# Patient Record
Sex: Female | Born: 1955 | Race: White | Hispanic: No | Marital: Married | Smoking: Current every day smoker
Health system: Southern US, Community
[De-identification: ages and names within clinical notes are randomized; demographics above are authoritative.]

## PROBLEM LIST (undated history)

## (undated) DIAGNOSIS — I639 Cerebral infarction, unspecified: Secondary | ICD-10-CM

## (undated) DIAGNOSIS — I10 Essential (primary) hypertension: Secondary | ICD-10-CM

## (undated) DIAGNOSIS — E079 Disorder of thyroid, unspecified: Secondary | ICD-10-CM

## (undated) DIAGNOSIS — B1920 Unspecified viral hepatitis C without hepatic coma: Secondary | ICD-10-CM

## (undated) HISTORY — PX: APPENDECTOMY: SHX54

## (undated) HISTORY — PX: CHOLECYSTECTOMY: SHX55

## (undated) HISTORY — PX: HAND SURGERY: SHX662

---

## 2014-11-20 ENCOUNTER — Telehealth: Payer: Self-pay | Admitting: Hematology & Oncology

## 2014-11-20 NOTE — Telephone Encounter (Signed)
LT MESS WITH Cindy Hubbard REGARDING MOTHERS NEW PT APPT . DAUGHTER CONFIRMED APPT

## 2014-11-27 ENCOUNTER — Encounter (HOSPITAL_COMMUNITY): Payer: Self-pay | Admitting: Emergency Medicine

## 2014-11-27 ENCOUNTER — Emergency Department (HOSPITAL_COMMUNITY)
Admission: EM | Admit: 2014-11-27 | Discharge: 2014-11-28 | Disposition: A | Discharge status: Home / Self Care | Payer: Managed Care, Other (non HMO) | Attending: Emergency Medicine | Admitting: Emergency Medicine

## 2014-11-27 ENCOUNTER — Emergency Department (HOSPITAL_COMMUNITY): Payer: Managed Care, Other (non HMO)

## 2014-11-27 DIAGNOSIS — Z720 Tobacco use: Secondary | ICD-10-CM | POA: Diagnosis not present

## 2014-11-27 DIAGNOSIS — R51 Headache: Secondary | ICD-10-CM | POA: Diagnosis present

## 2014-11-27 DIAGNOSIS — R112 Nausea with vomiting, unspecified: Secondary | ICD-10-CM | POA: Diagnosis not present

## 2014-11-27 DIAGNOSIS — I10 Essential (primary) hypertension: Secondary | ICD-10-CM | POA: Diagnosis not present

## 2014-11-27 DIAGNOSIS — Z8673 Personal history of transient ischemic attack (TIA), and cerebral infarction without residual deficits: Secondary | ICD-10-CM | POA: Diagnosis not present

## 2014-11-27 DIAGNOSIS — G44209 Tension-type headache, unspecified, not intractable: Secondary | ICD-10-CM

## 2014-11-27 DIAGNOSIS — Z8639 Personal history of other endocrine, nutritional and metabolic disease: Secondary | ICD-10-CM | POA: Diagnosis not present

## 2014-11-27 HISTORY — DX: Disorder of thyroid, unspecified: E07.9

## 2014-11-27 HISTORY — DX: Cerebral infarction, unspecified: I63.9

## 2014-11-27 HISTORY — DX: Essential (primary) hypertension: I10

## 2014-11-27 LAB — CBC
HCT: 49.2 % — ABNORMAL HIGH (ref 36.0–46.0)
Hemoglobin: 16.8 g/dL — ABNORMAL HIGH (ref 12.0–15.0)
MCH: 32.1 pg (ref 26.0–34.0)
MCHC: 34.1 g/dL (ref 30.0–36.0)
MCV: 93.9 fL (ref 78.0–100.0)
PLATELETS: 216 10*3/uL (ref 150–400)
RBC: 5.24 MIL/uL — ABNORMAL HIGH (ref 3.87–5.11)
RDW: 13.3 % (ref 11.5–15.5)
WBC: 7.1 10*3/uL (ref 4.0–10.5)

## 2014-11-27 LAB — BASIC METABOLIC PANEL
Anion gap: 11 (ref 5–15)
BUN: 7 mg/dL (ref 6–20)
CALCIUM: 8.8 mg/dL — AB (ref 8.9–10.3)
CO2: 29 mmol/L (ref 22–32)
Chloride: 105 mmol/L (ref 101–111)
Creatinine, Ser: 0.6 mg/dL (ref 0.44–1.00)
GFR calc Af Amer: >60 mL/min (ref >60)
GLUCOSE: 97 mg/dL (ref 65–99)
POTASSIUM: 3.7 mmol/L (ref 3.5–5.1)
Sodium: 145 mmol/L (ref 135–145)

## 2014-11-27 MED ORDER — SODIUM CHLORIDE 0.9 % IV SOLN: 1000.0000 mL | INTRAVENOUS | Status: DC

## 2014-11-27 MED ORDER — SODIUM CHLORIDE 0.9 % IV SOLN
1000.0000 mL | Freq: Once | INTRAVENOUS | Status: AC
  Administered 2014-11-28: 1000 mL via INTRAVENOUS

## 2014-11-27 MED ORDER — FENTANYL CITRATE (PF) 100 MCG/2ML IJ SOLN
INTRAMUSCULAR | Status: AC
  Filled 2014-11-27: qty 2

## 2014-11-27 MED ORDER — FENTANYL CITRATE (PF) 100 MCG/2ML IJ SOLN
50.0000 ug | Freq: Once | INTRAMUSCULAR | Status: AC
  Administered 2014-11-27: 50 ug via INTRAVENOUS

## 2014-11-27 MED ORDER — METOCLOPRAMIDE HCL 5 MG/ML IJ SOLN
10.0000 mg | Freq: Once | INTRAMUSCULAR | Status: AC
  Administered 2014-11-28: 10 mg via INTRAVENOUS
  Filled 2014-11-27: qty 2

## 2014-11-27 MED ORDER — DIPHENHYDRAMINE HCL 50 MG/ML IJ SOLN
25.0000 mg | Freq: Once | INTRAMUSCULAR | Status: AC
  Administered 2014-11-28: 25 mg via INTRAVENOUS
  Filled 2014-11-27: qty 1

## 2014-11-27 NOTE — ED Provider Notes (Signed)
CSN: [id number]     Arrival date & time 11/27/14  2114 History   This chart was scribed for Cindy Fuel, MD by Forrestine Him, ED Scribe. This patient was seen in room D32C/D32C and the patient's care was started 11:42 PM.   Chief Complaint  Patient presents with  . Headache   The history is provided by the patient. No language interpreter was used.    HPI Comments: Cindy Hubbard is a 59 y.o. female with a PMHx of HTN, stroke, and thyroid disease who presents to the Emergency Department complaining of constant, ongoing posterior HA onset 4:00 PM this afternoon; worsened in last few hours. Pain is described as throbbing and worsened with bright lighting. She also reports nausea and 1 episode of vomiting today. Ms. Goulding mentions weakness to her R hand, however, this has now resolved. No OTC medications or home remedies attempted prior to arrival. No recent fever, chills, shortness of breath, chest pain, or abdominal pain. No loss of sensation to extremities. Pt believes above symptoms may be related to her history of high blood pressure. Pt with known allergies to ASA, Biaxin, Demerol, NSAIDS, and Morphine.  Past Medical History  Diagnosis Date  . Stroke   . Hypertension   . Thyroid disease     hyperthyroid   Past Surgical History  Procedure Laterality Date  . Appendectomy    . Cholecystectomy    . Hand surgery     No family history on file. History  Substance Use Topics  . Smoking status: Current Every Day Smoker  . Smokeless tobacco: Not on file  . Alcohol Use: Yes   OB History    No data available     Review of Systems  Constitutional: Negative for fever and chills.  Respiratory: Negative for cough and shortness of breath.   Cardiovascular: Negative for chest pain.  Gastrointestinal: Positive for nausea and vomiting. Negative for abdominal pain.  Neurological: Positive for headaches.  Psychiatric/Behavioral: Negative for confusion.  All other systems reviewed and are  negative.     Allergies  Asa; Biaxin; Demerol; Morphine and related; and Nsaids  Home Medications   Prior to Admission medications   Not on File   Triage Vitals: BP 182/103 mmHg  Pulse 76  Temp(Src) 98 F (36.7 C) (Oral)  Resp 18  SpO2 96%   Physical Exam  Constitutional: She is oriented to person, place, and time. She appears well-developed and well-nourished.  HENT:  Head: Normocephalic.  Eyes: EOM are normal. Pupils are equal, round, and reactive to light.  Fundi are normal   Neck: Normal range of motion. Neck supple. No JVD present.  Moderate tenderness to insertion of L paracervical muscles   Cardiovascular: Normal rate, regular rhythm and normal heart sounds.   No murmur heard. Pulmonary/Chest: Effort normal and breath sounds normal. She has no wheezes. She has no rales. She exhibits no tenderness.  Abdominal: Soft. Bowel sounds are normal. She exhibits no distension and no mass. There is no tenderness.  Musculoskeletal: Normal range of motion. She exhibits no edema.  Lymphadenopathy:    She has no cervical adenopathy.  Neurological: She is alert and oriented to person, place, and time. No cranial nerve deficit.  Skin: Skin is warm and dry. No rash noted.  Psychiatric: She has a normal mood and affect. Thought content normal.  Nursing note and vitals reviewed.   ED Course  Procedures (including critical care time)  DIAGNOSTIC STUDIES: Oxygen Saturation is 96% on RA, adequate by  my interpretation.    COORDINATION OF CARE: 11:47 PM- Will give fluids, Reglan, Benadryl, and Sublimaze. Will order CT head without contrast, CBC, and BMP. Discussed treatment plan with pt at bedside and pt agreed to plan.     Labs Review Results for orders placed or performed during the hospital encounter of 11/27/14  CBC  Result Value Ref Range   WBC 7.1 4.0 - 10.5 K/uL   RBC 5.24 (H) 3.87 - 5.11 MIL/uL   Hemoglobin 16.8 (H) 12.0 - 15.0 g/dL   HCT 49.2 (H) 36.0 - 46.0 %   MCV  93.9 78.0 - 100.0 fL   MCH 32.1 26.0 - 34.0 pg   MCHC 34.1 30.0 - 36.0 g/dL   RDW 13.3 11.5 - 15.5 %   Platelets 216 150 - 400 K/uL  Basic metabolic panel  Result Value Ref Range   Sodium 145 135 - 145 mmol/L   Potassium 3.7 3.5 - 5.1 mmol/L   Chloride 105 101 - 111 mmol/L   CO2 29 22 - 32 mmol/L   Glucose, Bld 97 65 - 99 mg/dL   BUN 7 6 - 20 mg/dL   Creatinine, Ser 0.60 0.44 - 1.00 mg/dL   Calcium 8.8 (L) 8.9 - 10.3 mg/dL   GFR calc non Af Amer >60 >60 mL/min   GFR calc Af Amer >60 >60 mL/min   Anion gap 11 5 - 15    Imaging Review Ct Head Wo Contrast  11/27/2014   CLINICAL DATA:  Progressive headache for 1 day  EXAM: CT HEAD WITHOUT CONTRAST  TECHNIQUE: Contiguous axial images were obtained from the base of the skull through the vertex without intravenous contrast.  COMPARISON:  None.  FINDINGS: There is slight diffuse atrophy. There is no intracranial mass, hemorrhage, extra-axial fluid collection, or midline shift. There is minimal small vessel disease in the centra semiovale bilaterally. Elsewhere, gray-white compartments appear normal. There is no demonstrable acute infarct. Bony calvarium appears intact. Mastoid air cells are clear.  IMPRESSION: Slight atrophy with minimal periventricular small vessel disease. No intracranial mass, hemorrhage, or evidence of acute infarct.   Electronically Signed   By: Lowella Grip III M.D.   On: 11/27/2014 22:27     MDM   Final diagnoses:  Muscle contraction headache    Headache with tenderness of paracervical muscles suggestive of muscle contraction headache. CT of been ordered prior to my seeing her and was unremarkable. She is given a headache cocktail of IV normal saline and metoclopramide and diphenhydramine with excellent relief of headache. She is discharged with prescription for metoclopramide.  I personally performed the services described in this documentation, which was scribed in my presence. The recorded information has been  reviewed and is accurate.     Cindy Fuel, MD [id number]

## 2014-11-27 NOTE — ED Notes (Signed)
Headache started at approx 4pm; blurry vision earlier;

## 2014-11-27 NOTE — ED Notes (Signed)
MD at bedside. 

## 2014-11-27 NOTE — ED Notes (Signed)
Pt sts she is not allergic to fentanyl but is allergic to many other things.

## 2014-11-27 NOTE — ED Notes (Signed)
Pt reports severe headache that started this am which has progressively gotten worse. Pt sts last time she had a headache this bad she had a CVA. Denies weakness/numbness. Speech clear. Pt htn. Pt took bp meds today.

## 2014-11-28 MED ORDER — METOCLOPRAMIDE HCL 10 MG PO TABS: 10.0000 mg | ORAL_TABLET | Freq: Four times a day (QID) | ORAL | Status: DC | PRN

## 2014-11-28 NOTE — Discharge Instructions (Signed)
Tension Headache °A tension headache is a feeling of pain, pressure, or aching often felt over the front and sides of the head. The pain can be dull or can feel tight (constricting). It is the most common type of headache. Tension headaches are not normally associated with nausea or vomiting and do not get worse with physical activity. Tension headaches can last 30 minutes to several days.  °CAUSES  °The exact cause is not known, but it may be caused by chemicals and hormones in the brain that lead to pain. Tension headaches often begin after stress, anxiety, or depression. Other triggers may include: °· Alcohol. °· Caffeine (too much or withdrawal). °· Respiratory infections (colds, flu, sinus infections). °· Dental problems or teeth clenching. °· Fatigue. °· Holding your head and neck in one position too long while using a computer. °SYMPTOMS  °· Pressure around the head.   °· Dull, aching head pain.   °· Pain felt over the front and sides of the head.   °· Tenderness in the muscles of the head, neck, and shoulders. °DIAGNOSIS  °A tension headache is often diagnosed based on:  °· Symptoms.   °· Physical examination.   °· A CT scan or MRI of your head. These tests may be ordered if symptoms are severe or unusual. °TREATMENT  °Medicines may be given to help relieve symptoms.  °HOME CARE INSTRUCTIONS  °· Only take over-the-counter or prescription medicines for pain or discomfort as directed by your caregiver.   °· Lie down in a dark, quiet room when you have a headache.   °· Keep a journal to find out what may be triggering your headaches. For example, write down: °¨ What you eat and drink. °¨ How much sleep you get. °¨ Any change to your diet or medicines. °· Try massage or other relaxation techniques.   °· Ice packs or heat applied to the head and neck can be used. Use these 3 to 4 times per day for 15 to 20 minutes each time, or as needed.   °· Limit stress.   °· Sit up straight, and do not tense your muscles.    °· Quit smoking if you smoke. °· Limit alcohol use. °· Decrease the amount of caffeine you drink, or stop drinking caffeine. °· Eat and exercise regularly. °· Get 7 to 9 hours of sleep, or as recommended by your caregiver. °· Avoid excessive use of pain medicine as recurrent headaches can occur.    °SEEK MEDICAL CARE IF:  °· You have problems with the medicines you were prescribed. °· Your medicines do not work. °· You have a change from the usual headache. °· You have nausea or vomiting. °SEEK IMMEDIATE MEDICAL CARE IF:  °· Your headache becomes severe. °· You have a fever. °· You have a stiff neck. °· You have loss of vision. °· You have muscular weakness or loss of muscle control. °· You lose your balance or have trouble walking. °· You feel faint or pass out. °· You have severe symptoms that are different from your first symptoms. °MAKE SURE YOU:  °· Understand these instructions. °· Will watch your condition. °· Will get help right away if you are not doing well or get worse. °Document Released: 04/07/2005 Document Revised: 06/30/2011 Document Reviewed: 03/28/2011 °[employer]® Patient Information ©2015 [employer]. This information is not intended to replace advice given to you by your health care provider. Make sure you discuss any questions you have with your health care provider. ° °Metoclopramide tablets °What is this medicine? °METOCLOPRAMIDE (met oh kloe PRA mide)   is used to treat the symptoms of gastroesophageal reflux disease (GERD) like heartburn. It is also used to treat people with slow emptying of the stomach and intestinal tract. °This medicine may be used for other purposes; ask your health care provider or pharmacist if you have questions. °COMMON BRAND NAME(S): Reglan °What should I tell my health care provider before I take this medicine? °They need to know if you have any of these conditions: °-breast cancer °-depression °-diabetes °-heart failure °-high blood pressure °-kidney  disease °-liver disease °-Parkinson's disease or a movement disorder °-pheochromocytoma °-seizures °-stomach obstruction, bleeding, or perforation °-an unusual or allergic reaction to metoclopramide, procainamide, sulfites, other medicines, foods, dyes, or preservatives °-pregnant or trying to get pregnant °-breast-feeding °How should I use this medicine? °Take this medicine by mouth with a glass of water. Follow the directions on the prescription label. Take this medicine on an empty stomach, about 30 minutes before eating. Take your doses at regular intervals. Do not take your medicine more often than directed. Do not stop taking except on the advice of your doctor or health care professional. °A special MedGuide will be given to you by the pharmacist with each prescription and refill. Be sure to read this information carefully each time. °Talk to your pediatrician regarding the use of this medicine in children. Special care may be needed. °Overdosage: If you think you have taken too much of this medicine contact a poison control center or emergency room at once. °NOTE: This medicine is only for you. Do not share this medicine with others. °What if I miss a dose? °If you miss a dose, take it as soon as you can. If it is almost time for your next dose, take only that dose. Do not take double or extra doses. °What may interact with this medicine? °-acetaminophen °-cyclosporine °-digoxin °-medicines for blood pressure °-medicines for diabetes, including insulin °-medicines for hay fever and other allergies °-medicines for depression, especially an Monoamine Oxidase Inhibitor (MAOI) °-medicines for Parkinson's disease, like levodopa °-medicines for sleep or for pain °-tetracycline °This list may not describe all possible interactions. Give your health care provider a list of all the medicines, herbs, non-prescription drugs, or dietary supplements you use. Also tell them if you smoke, drink alcohol, or use illegal  drugs. Some items may interact with your medicine. °What should I watch for while using this medicine? °It may take a few weeks for your stomach condition to start to get better. However, do not take this medicine for longer than 12 weeks. The longer you take this medicine, and the more you take it, the greater your chances are of developing serious side effects. If you are an elderly patient, a female patient, or you have diabetes, you may be at an increased risk for side effects from this medicine. Contact your doctor immediately if you start having movements you cannot control such as lip smacking, rapid movements of the tongue, involuntary or uncontrollable movements of the eyes, head, arms and legs, or muscle twitches and spasms. °Patients and their families should watch out for worsening depression or thoughts of suicide. Also watch out for any sudden or severe changes in feelings such as feeling anxious, agitated, panicky, irritable, hostile, aggressive, impulsive, severely restless, overly excited and hyperactive, or not being able to sleep. If this happens, especially at the beginning of treatment or after a change in dose, call your doctor. °Do not treat yourself for high fever. Ask your doctor or health care professional for   advice. You may get drowsy or dizzy. Do not drive, use machinery, or do anything that needs mental alertness until you know how this drug affects you. Do not stand or sit up quickly, especially if you are an older patient. This reduces the risk of dizzy or fainting spells. Alcohol can make you more drowsy and dizzy. Avoid alcoholic drinks. What side effects may I notice from receiving this medicine? Side effects that you should report to your doctor or health care professional as soon as possible: -allergic reactions like skin rash, itching or hives, swelling of the face, lips, or tongue -abnormal production of milk in females -breast enlargement in both males and  females -change in the way you walk -difficulty moving, speaking or swallowing -drooling, lip smacking, or rapid movements of the tongue -excessive sweating -fever -involuntary or uncontrollable movements of the eyes, head, arms and legs -irregular heartbeat or palpitations -muscle twitches and spasms -unusually weak or tired Side effects that usually do not require medical attention (report to your doctor or health care professional if they continue or are bothersome): -change in sex drive or performance -depressed mood -diarrhea -difficulty sleeping -headache -menstrual changes -restless or nervous This list may not describe all possible side effects. Call your doctor for medical advice about side effects. You may report side effects to FDA at 1-800-FDA-1088. Where should I keep my medicine? Keep out of the reach of children. Store at room temperature between 20 and 25 degrees C (68 and 77 degrees F). Protect from light. Keep container tightly closed. Throw away any unused medicine after the expiration date. NOTE: This sheet is a summary. It may not cover all possible information. If you have questions about this medicine, talk to your doctor, pharmacist, or health care provider.  2015, [employer]. (2011-08-05 13:04:38)

## 2014-12-26 ENCOUNTER — Telehealth: Payer: Self-pay | Admitting: Hematology & Oncology

## 2014-12-26 NOTE — Telephone Encounter (Signed)
Patient call and cx new patient apt.  She stated her daughter made this apt and she already has an Materials engineer.

## 2014-12-27 ENCOUNTER — Ambulatory Visit: Payer: Managed Care, Other (non HMO) | Admitting: Hematology & Oncology

## 2014-12-27 ENCOUNTER — Ambulatory Visit: Payer: Managed Care, Other (non HMO)

## 2014-12-27 ENCOUNTER — Other Ambulatory Visit: Payer: Managed Care, Other (non HMO)

## 2019-03-22 ENCOUNTER — Other Ambulatory Visit: Payer: Self-pay

## 2019-03-22 DIAGNOSIS — Z20822 Contact with and (suspected) exposure to covid-19: Secondary | ICD-10-CM

## 2019-03-25 LAB — NOVEL CORONAVIRUS, NAA: SARS-CoV-2, NAA: NOT DETECTED

## 2019-06-01 ENCOUNTER — Inpatient Hospital Stay (HOSPITAL_COMMUNITY)
Admission: EM | Admit: 2019-06-01 | Discharge: 2019-06-20 | DRG: 871 | Disposition: E | Payer: 59 | Attending: Critical Care Medicine | Admitting: Critical Care Medicine

## 2019-06-01 ENCOUNTER — Other Ambulatory Visit: Payer: Self-pay

## 2019-06-01 ENCOUNTER — Encounter (HOSPITAL_COMMUNITY): Payer: Self-pay | Admitting: Emergency Medicine

## 2019-06-01 DIAGNOSIS — I8511 Secondary esophageal varices with bleeding: Secondary | ICD-10-CM | POA: Diagnosis present

## 2019-06-01 DIAGNOSIS — E871 Hypo-osmolality and hyponatremia: Secondary | ICD-10-CM | POA: Diagnosis present

## 2019-06-01 DIAGNOSIS — Z79890 Hormone replacement therapy (postmenopausal): Secondary | ICD-10-CM | POA: Diagnosis not present

## 2019-06-01 DIAGNOSIS — Z885 Allergy status to narcotic agent status: Secondary | ICD-10-CM

## 2019-06-01 DIAGNOSIS — D696 Thrombocytopenia, unspecified: Secondary | ICD-10-CM

## 2019-06-01 DIAGNOSIS — D539 Nutritional anemia, unspecified: Secondary | ICD-10-CM | POA: Diagnosis present

## 2019-06-01 DIAGNOSIS — B182 Chronic viral hepatitis C: Secondary | ICD-10-CM | POA: Diagnosis not present

## 2019-06-01 DIAGNOSIS — E7220 Disorder of urea cycle metabolism, unspecified: Secondary | ICD-10-CM | POA: Diagnosis not present

## 2019-06-01 DIAGNOSIS — Z515 Encounter for palliative care: Secondary | ICD-10-CM | POA: Diagnosis not present

## 2019-06-01 DIAGNOSIS — Z8673 Personal history of transient ischemic attack (TIA), and cerebral infarction without residual deficits: Secondary | ICD-10-CM

## 2019-06-01 DIAGNOSIS — D689 Coagulation defect, unspecified: Secondary | ICD-10-CM

## 2019-06-01 DIAGNOSIS — R892 Abnormal level of other drugs, medicaments and biological substances in specimens from other organs, systems and tissues: Secondary | ICD-10-CM | POA: Diagnosis not present

## 2019-06-01 DIAGNOSIS — F17200 Nicotine dependence, unspecified, uncomplicated: Secondary | ICD-10-CM | POA: Diagnosis not present

## 2019-06-01 DIAGNOSIS — D65 Disseminated intravascular coagulation [defibrination syndrome]: Secondary | ICD-10-CM | POA: Diagnosis present

## 2019-06-01 DIAGNOSIS — K7201 Acute and subacute hepatic failure with coma: Secondary | ICD-10-CM

## 2019-06-01 DIAGNOSIS — N185 Chronic kidney disease, stage 5: Secondary | ICD-10-CM | POA: Diagnosis present

## 2019-06-01 DIAGNOSIS — D62 Acute posthemorrhagic anemia: Secondary | ICD-10-CM | POA: Diagnosis present

## 2019-06-01 DIAGNOSIS — K7041 Alcoholic hepatic failure with coma: Secondary | ICD-10-CM | POA: Diagnosis not present

## 2019-06-01 DIAGNOSIS — I959 Hypotension, unspecified: Secondary | ICD-10-CM

## 2019-06-01 DIAGNOSIS — I10 Essential (primary) hypertension: Secondary | ICD-10-CM

## 2019-06-01 DIAGNOSIS — E039 Hypothyroidism, unspecified: Secondary | ICD-10-CM | POA: Diagnosis present

## 2019-06-01 DIAGNOSIS — K7011 Alcoholic hepatitis with ascites: Secondary | ICD-10-CM | POA: Diagnosis not present

## 2019-06-01 DIAGNOSIS — K767 Hepatorenal syndrome: Secondary | ICD-10-CM | POA: Diagnosis present

## 2019-06-01 DIAGNOSIS — R680 Hypothermia, not associated with low environmental temperature: Secondary | ICD-10-CM | POA: Diagnosis present

## 2019-06-01 DIAGNOSIS — I120 Hypertensive chronic kidney disease with stage 5 chronic kidney disease or end stage renal disease: Secondary | ICD-10-CM | POA: Diagnosis not present

## 2019-06-01 DIAGNOSIS — K922 Gastrointestinal hemorrhage, unspecified: Secondary | ICD-10-CM | POA: Diagnosis present

## 2019-06-01 DIAGNOSIS — K7682 Hepatic encephalopathy: Secondary | ICD-10-CM

## 2019-06-01 DIAGNOSIS — T68XXXA Hypothermia, initial encounter: Secondary | ICD-10-CM

## 2019-06-01 DIAGNOSIS — N179 Acute kidney failure, unspecified: Secondary | ICD-10-CM | POA: Diagnosis not present

## 2019-06-01 DIAGNOSIS — R578 Other shock: Secondary | ICD-10-CM | POA: Diagnosis not present

## 2019-06-01 DIAGNOSIS — K7031 Alcoholic cirrhosis of liver with ascites: Secondary | ICD-10-CM | POA: Diagnosis not present

## 2019-06-01 DIAGNOSIS — R34 Anuria and oliguria: Secondary | ICD-10-CM | POA: Diagnosis not present

## 2019-06-01 DIAGNOSIS — R6520 Severe sepsis without septic shock: Secondary | ICD-10-CM | POA: Diagnosis not present

## 2019-06-01 DIAGNOSIS — R571 Hypovolemic shock: Secondary | ICD-10-CM | POA: Diagnosis not present

## 2019-06-01 DIAGNOSIS — R4587 Impulsiveness: Secondary | ICD-10-CM | POA: Diagnosis not present

## 2019-06-01 DIAGNOSIS — K921 Melena: Secondary | ICD-10-CM | POA: Diagnosis present

## 2019-06-01 DIAGNOSIS — Z20822 Contact with and (suspected) exposure to covid-19: Secondary | ICD-10-CM | POA: Diagnosis present

## 2019-06-01 DIAGNOSIS — R17 Unspecified jaundice: Secondary | ICD-10-CM

## 2019-06-01 DIAGNOSIS — K7290 Hepatic failure, unspecified without coma: Secondary | ICD-10-CM | POA: Diagnosis not present

## 2019-06-01 DIAGNOSIS — E8729 Other acidosis: Secondary | ICD-10-CM

## 2019-06-01 DIAGNOSIS — Z886 Allergy status to analgesic agent status: Secondary | ICD-10-CM

## 2019-06-01 DIAGNOSIS — E872 Acidosis: Secondary | ICD-10-CM | POA: Diagnosis present

## 2019-06-01 DIAGNOSIS — E8770 Fluid overload, unspecified: Secondary | ICD-10-CM | POA: Diagnosis not present

## 2019-06-01 DIAGNOSIS — A419 Sepsis, unspecified organism: Principal | ICD-10-CM | POA: Diagnosis present

## 2019-06-01 DIAGNOSIS — E8809 Other disorders of plasma-protein metabolism, not elsewhere classified: Secondary | ICD-10-CM | POA: Diagnosis not present

## 2019-06-01 DIAGNOSIS — Z881 Allergy status to other antibiotic agents status: Secondary | ICD-10-CM

## 2019-06-01 DIAGNOSIS — Z6841 Body Mass Index (BMI) 40.0 and over, adult: Secondary | ICD-10-CM

## 2019-06-01 DIAGNOSIS — D649 Anemia, unspecified: Secondary | ICD-10-CM

## 2019-06-01 DIAGNOSIS — Z66 Do not resuscitate: Secondary | ICD-10-CM | POA: Diagnosis not present

## 2019-06-01 DIAGNOSIS — R7401 Elevation of levels of liver transaminase levels: Secondary | ICD-10-CM

## 2019-06-01 DIAGNOSIS — E440 Moderate protein-calorie malnutrition: Secondary | ICD-10-CM | POA: Diagnosis not present

## 2019-06-01 DIAGNOSIS — R9431 Abnormal electrocardiogram [ECG] [EKG]: Secondary | ICD-10-CM | POA: Diagnosis not present

## 2019-06-01 DIAGNOSIS — M4800 Spinal stenosis, site unspecified: Secondary | ICD-10-CM | POA: Diagnosis present

## 2019-06-01 DIAGNOSIS — E669 Obesity, unspecified: Secondary | ICD-10-CM | POA: Diagnosis present

## 2019-06-01 HISTORY — DX: Unspecified viral hepatitis C without hepatic coma: B19.20

## 2019-06-01 LAB — CBC
HCT: 17.9 % — ABNORMAL LOW (ref 36.0–46.0)
Hemoglobin: 6.7 g/dL — CL (ref 12.0–15.0)
MCH: 40.9 pg — ABNORMAL HIGH (ref 26.0–34.0)
MCHC: 37.4 g/dL — ABNORMAL HIGH (ref 30.0–36.0)
MCV: 109.1 fL — ABNORMAL HIGH (ref 80.0–100.0)
Platelets: 49 10*3/uL — ABNORMAL LOW (ref 150–400)
RBC: 1.64 MIL/uL — ABNORMAL LOW (ref 3.87–5.11)
RDW: 21.1 % — ABNORMAL HIGH (ref 11.5–15.5)
WBC: 8 10*3/uL (ref 4.0–10.5)
nRBC: 3.4 % — ABNORMAL HIGH (ref 0.0–0.2)

## 2019-06-01 LAB — COMPREHENSIVE METABOLIC PANEL
ALT: 99 U/L — ABNORMAL HIGH (ref 0–44)
AST: 136 U/L — ABNORMAL HIGH (ref 15–41)
Albumin: 2.1 g/dL — ABNORMAL LOW (ref 3.5–5.0)
Alkaline Phosphatase: 80 U/L (ref 38–126)
Anion gap: 19 — ABNORMAL HIGH (ref 5–15)
BUN: 50 mg/dL — ABNORMAL HIGH (ref 8–23)
CO2: 20 mmol/L — ABNORMAL LOW (ref 22–32)
Calcium: 7.7 mg/dL — ABNORMAL LOW (ref 8.9–10.3)
Chloride: 88 mmol/L — ABNORMAL LOW (ref 98–111)
Creatinine, Ser: 5.79 mg/dL — ABNORMAL HIGH (ref 0.44–1.00)
GFR calc Af Amer: 8 mL/min — ABNORMAL LOW (ref >60)
GFR calc non Af Amer: 7 mL/min — ABNORMAL LOW (ref >60)
Glucose, Bld: 96 mg/dL (ref 70–99)
Potassium: 4.8 mmol/L (ref 3.5–5.1)
Sodium: 127 mmol/L — ABNORMAL LOW (ref 135–145)
Total Bilirubin: 10.5 mg/dL — ABNORMAL HIGH (ref 0.3–1.2)
Total Protein: 5.9 g/dL — ABNORMAL LOW (ref 6.5–8.1)

## 2019-06-01 LAB — ETHANOL: Alcohol, Ethyl (B): <10 mg/dL (ref <10)

## 2019-06-01 LAB — RESPIRATORY PANEL BY RT PCR (FLU A&B, COVID)
Influenza A by PCR: NEGATIVE
Influenza B by PCR: NEGATIVE
SARS Coronavirus 2 by RT PCR: NEGATIVE

## 2019-06-01 LAB — RAPID URINE DRUG SCREEN, HOSP PERFORMED
Amphetamines: NOT DETECTED
Barbiturates: NOT DETECTED
Benzodiazepines: POSITIVE — AB
Cocaine: NOT DETECTED
Opiates: NOT DETECTED
Tetrahydrocannabinol: NOT DETECTED

## 2019-06-01 LAB — ABO/RH: ABO/RH(D): O POS

## 2019-06-01 LAB — POC OCCULT BLOOD, ED: Fecal Occult Bld: POSITIVE — AB

## 2019-06-01 LAB — AMMONIA: Ammonia: 130 umol/L — ABNORMAL HIGH (ref 9–35)

## 2019-06-01 LAB — PROTIME-INR
INR: 5.8 (ref 0.8–1.2)
Prothrombin Time: 52.3 seconds — ABNORMAL HIGH (ref 11.4–15.2)

## 2019-06-01 LAB — PREPARE RBC (CROSSMATCH)

## 2019-06-01 MED ORDER — SODIUM CHLORIDE 0.9 % IV SOLN
50.0000 ug/h | INTRAVENOUS | Status: DC
  Administered 2019-06-01 – 2019-06-02 (×3): 50 ug/h via INTRAVENOUS
  Filled 2019-06-01 (×5): qty 1

## 2019-06-01 MED ORDER — OCTREOTIDE ACETATE 500 MCG/ML IJ SOLN
INTRAMUSCULAR | Status: AC
  Filled 2019-06-01: qty 1

## 2019-06-01 MED ORDER — PANTOPRAZOLE SODIUM 40 MG IV SOLR: 40.0000 mg | Freq: Two times a day (BID) | INTRAVENOUS | Status: DC

## 2019-06-01 MED ORDER — SODIUM CHLORIDE 0.9% IV SOLUTION: Freq: Once | INTRAVENOUS | Status: AC

## 2019-06-01 MED ORDER — LACTULOSE ENEMA
300.0000 mL | Freq: Two times a day (BID) | ORAL | Status: DC
  Administered 2019-06-02 (×3): 300 mL via RECTAL
  Filled 2019-06-01 (×9): qty 300

## 2019-06-01 MED ORDER — SODIUM CHLORIDE 0.9% IV SOLUTION: Freq: Once | INTRAVENOUS | Status: DC

## 2019-06-01 MED ORDER — PANTOPRAZOLE SODIUM 40 MG IV SOLR
40.0000 mg | Freq: Once | INTRAVENOUS | Status: AC
  Administered 2019-06-01: 20:00:00 40 mg via INTRAVENOUS
  Filled 2019-06-01: qty 40

## 2019-06-01 MED ORDER — SODIUM CHLORIDE 0.9 % IV BOLUS
1000.0000 mL | Freq: Once | INTRAVENOUS | Status: AC
  Administered 2019-06-01: 20:00:00 1000 mL via INTRAVENOUS

## 2019-06-01 MED ORDER — SODIUM CHLORIDE 0.9 % IV SOLN
8.0000 mg/h | INTRAVENOUS | Status: DC
  Administered 2019-06-02 – 2019-06-03 (×3): 8 mg/h via INTRAVENOUS
  Filled 2019-06-01 (×4): qty 80

## 2019-06-01 MED ORDER — OCTREOTIDE LOAD VIA INFUSION
50.0000 ug | Freq: Once | INTRAVENOUS | Status: AC
  Administered 2019-06-01: 50 ug via INTRAVENOUS
  Filled 2019-06-01: qty 25

## 2019-06-01 MED ORDER — SODIUM CHLORIDE 0.9 % IV SOLN
80.0000 mg | Freq: Once | INTRAVENOUS | Status: AC
  Administered 2019-06-02: 80 mg via INTRAVENOUS
  Filled 2019-06-01: qty 80

## 2019-06-01 NOTE — ED Notes (Signed)
Bear Hugger Applied to Pt. MD made aware of Pts core Temp of 90.8 F rectal.

## 2019-06-01 NOTE — H&P (Addendum)
History and Physical  Cindy Hubbard [id number] DOB: [birth year] DOA: 06/05/2019  Referring physician: Nat Christen PCP: Curly Rim, MD  Patient coming from:  [facility]  Chief Complaint: Rectal Bleeding  HPI: Cindy Hubbard is a 64 y.o. female with medical history significant for hypertension, hepatitis C, hypothyroidism and stroke who presents to [facility] emergency department due to 3-week onset of coffee colored emesis, she also endorsed dark stool within the same timeframe.  She has history of alcohol abuse, but she states that last alcohol consumption was about 2 weeks ago.  Patient complained of increased tiredness and weakness, so she came to the ED for further evaluation.  She denies chest pain, abdominal pain or shortness of breath.  ED Course:  In the emergency department, she was noted to be hypothermic and hypotensive.  Lab work-up showed anemia (H/H of 6.7/17.9) with MCV of 109.1, thrombocytopenia, elevated BUN to creatinine 50/5.79 (no recent labs for comparison of baseline creatinine-last creatinine on record was 0.6 about 4 years ago).  INR 5.8, Albumin 2.1, AST 136, ALT 99, total bilirubin 10.5 and ammonia 130.  Fecal occult blood was positive. SARS-CoV-2 RNA was negative.  Influenza A and B were negative.  Patient was started on IV Protonix drip, IV octreotide was given and patient was provided with IV hydration.  Hospitalist was asked to admit him for further evaluation and management.  Review of Systems: Constitutional: Negative for chills and fever.  HENT: Negative for ear pain and sore throat.   Eyes: Negative for pain and visual disturbance.  Respiratory: Negative for cough, chest tightness and shortness of breath.   Cardiovascular: Negative for chest pain and palpitations.  Gastrointestinal: Positive for vomiting of blood and blood in stool.  Negative for abdominal pain and vomiting.  Endocrine: Negative for polyphagia and polyuria.  Genitourinary: Negative for  decreased urine volume, dysuria, enuresis Musculoskeletal: Negative for arthralgias and back pain.  Skin: Negative for color change and rash.  Allergic/Immunologic: Negative for immunocompromised state.  Neurological: Negative for tremors, syncope, speech difficulty, weakness, light-headedness and headaches.  Hematological: Does not bruise/bleed easily.  All other systems reviewed and are negative   Past Medical History:  Diagnosis Date  . Hepatitis C   . Hypertension   . Stroke ([place])   . Thyroid disease    hyperthyroid   Past Surgical History:  Procedure Laterality Date  . APPENDECTOMY    . CHOLECYSTECTOMY    . HAND SURGERY      Social History:  reports that she has been smoking. She has never used smokeless tobacco. She reports current alcohol use. She reports that she does not use drugs.   Allergies  Allergen Reactions  . Demerol [Meperidine] Anaphylaxis  . Morphine And Related Anaphylaxis  . Asa [Aspirin] Other (See Comments)    bleeding  . Biaxin [Clarithromycin] Other (See Comments)    unknown  . Nsaids Nausea And Vomiting    History reviewed. No pertinent family history.   Prior to Admission medications   Medication Sig Start Date End Date Taking? Authorizing Provider  carvedilol (COREG) 25 MG tablet Take 25 mg by mouth 2 (two) times daily with a meal.    [provider]  cloNIDine (CATAPRES) 0.2 MG tablet Take 0.2 mg by mouth 2 (two) times daily.    [provider]  diazepam (VALIUM) 10 MG tablet Take 10 mg by mouth every 6 (six) hours as needed for anxiety.    [provider]  EPINEPHrine 0.3 mg/0.3 mL  IJ SOAJ injection Inject 0.3 mg into the muscle daily as needed (allergic reaction).    [provider]  levothyroxine (SYNTHROID, LEVOTHROID) 150 MCG tablet Take 150 mcg by mouth daily before breakfast.    [provider]  lisinopril (PRINIVIL,ZESTRIL) 40 MG tablet Take 40 mg by mouth daily.    [provider]  metoCLOPramide (REGLAN) 10 MG tablet Take 1 tablet (10 mg total) by mouth every 6 (six) hours as needed for nausea (or headache). [id number]   Delora Fuel, MD  oxyCODONE-acetaminophen (PERCOCET/ROXICET) 5-325 MG per tablet Take 1 tablet by mouth every 4 (four) hours as needed for severe pain.    [provider]    Physical Exam: BP (!) 83/51   Pulse 61   Temp (!) 94.2 F (34.6 C) (Rectal)   Resp 16   Ht 5\' 5"  (1.651 m)   Wt 90.7 kg   SpO2 98%   BMI 33.28 kg/m   . General: 64 y.o. year-old female well developed well nourished in no acute distress.  Alert and oriented x3. Marland Kitchen HEENT: Normocephalic, atraumatic, sclera icteric . Neck: Supple, trachea medial . Cardiovascular: Regular rate and rhythm with no rubs or gallops.  No thyromegaly or JVD noted.  No lower extremity edema. 2/4 pulses in all 4 extremities. Marland Kitchen Respiratory: Clear to auscultation with no wheezes or rales. Good inspiratory effort. . Abdomen: Soft nontender nondistended with normal bowel sounds x4 quadrants. . Muskuloskeletal: No cyanosis, clubbing or edema noted bilaterally . Neuro: CN II-XII intact, strength, sensation, reflexes . Skin: No ulcerative lesions noted or rashes . Psychiatry: Judgement and insight appear normal. Mood is appropriate for condition and setting          Labs on Admission:  Basic Metabolic Panel: Recent Labs  Lab 06/04/2019 1918  NA 127*  K 4.8  CL 88*  CO2 20*  GLUCOSE 96  BUN 50*  CREATININE 5.79*  CALCIUM 7.7*   Liver Function Tests: Recent Labs  Lab 05/28/2019 1918  AST 136*  ALT 99*  ALKPHOS 80  BILITOT 10.5*  PROT 5.9*  ALBUMIN 2.1*   No results for input(s): LIPASE, AMYLASE in the last 168 hours. Recent Labs  Lab 06/16/2019 2139  AMMONIA 130*   CBC: Recent Labs  Lab 06/16/2019 1918  WBC 8.0  HGB 6.7*  HCT 17.9*  MCV 109.1*  PLT 49*   Cardiac Enzymes: No results for input(s): CKTOTAL, CKMB, CKMBINDEX, TROPONINI in the last 168 hours.  BNP (last 3  results) No results for input(s): BNP in the last 8760 hours.  ProBNP (last 3 results) No results for input(s): PROBNP in the last 8760 hours.  CBG: No results for input(s): GLUCAP in the last 168 hours.  Radiological Exams on Admission: No results found.  EKG: I independently viewed the EKG done and my findings are as followed: Sinus bradycardia at rate of 52bpm with nonspecific T wave abnormality.  Prolonged QTc (527ms)  Assessment/Plan Present on Admission: . Acute GI bleeding  Principal Problem:   Acute GI bleeding Active Problems:   Hypothermia   Hyponatremia   Hypotension   Hyperammonemia (HCC)   Thrombocytopenia (HCC)   AKI (acute kidney injury) ([place])   Hypoalbuminemia   Chronic hepatitis C without hepatic coma (HCC)   Hypothyroidism   Essential hypertension   Transaminitis   Nonspecific abnormal toxicology   Prolonged QT interval  Acute GI bleeding H/H= 6.7/17.9, no recent labs for comparison Hemoccult was positive Type and crossmatch was done 1 unit  of PRBC transfusion was started in the ED Continue IV Protonix drip Continue IV octreotide drip (due to upper GI bleed) N.p.o. after midnight Gastroenterologist  will be consulted  Hypothermia Temp: 90.18F, Bair Hugger provided TSH will be checked  Hyponatremia Na 127, IV hydration provided in the ED Urine sodium and urine osmolality will be checked Continue to monitor sodium levels with morning labs  Hyperammonemia possibly secondary to patient's history of chronic hepatitis C Ammonia level was 130 Continue on lactulose Continue to monitor ammonia level  Transaminitis possibly secondary to history of alcohol abuse/chronic hepatitis C Elevated PT/INR AST 136, ALT 99 PT/INR 52.3/5.8 Continue to monitor liver panel with morning labs  Thrombocytopenia  Platelets 49, posiibly to GI Bleed and Chronic Hep C 1 unit of platelet transfusion started in the ED Continue to monitor platelet level with morning  labs  Acute kidney injury on ?? CKD 5 Creatinine on admission=50/5.79 (no recent labs for comparison of baseline creatinine-last creatinine on record was 0.6 about 4 years ago).   Renally adjust medications, avoid nephrotoxic agents/dehydration/hypotension  Prolonged QTc Avoid QT prolonging drugs Magnesium level will be checked Repeat EKG in the morning  Hypoalbuminemia secondary to moderate protein calorie malnutrition Albumin 2.1, protein supplement will be provided  Chronic hepatitis C Continue management as per above  Hypothyroidism Continue Synthroid per home regimen  Essential hypertension Hypotension Patient will be admitted to ICU Hold BP meds at this time due to hypotension [facility] PCCM (Dr Lucile Shutters) consulted and patient was accepted to ICU  Nonspecific abnormal toxicology Urine drug screen was positive for benzodiazepine Home meds include diazepam     DVT prophylaxis: SCDs  Code Status: Full  Family Communication: None at bedside  Disposition Plan: [facility] with plan to discharge patient home pending gastroenterologist recommendation  Consults called: Gershon Mussel, PCCM  Admission status: Inpatient    Bernadette Hoit MD [facility]  If 7PM-7AM, please contact night-coverage [email or web address]  06/02/2019, 1:30 AM

## 2019-06-01 NOTE — ED Notes (Signed)
Date and time results received: 06/19/2019 2220   Test: INR Critical Value: 5.8 Name of Provider Notified: Lacinda Axon, MD

## 2019-06-01 NOTE — ED Notes (Signed)
Hospitalist at bedside 

## 2019-06-01 NOTE — ED Provider Notes (Addendum)
[facility] EMERGENCY DEPARTMENT Provider Note   CSN: [id number] Arrival date & time: 05/28/2019  1906     History Chief Complaint  Patient presents with  . Rectal Bleeding    Cindy Hubbard is a 64 y.o. female.  Level 5 caveat for acuity of condition.  Black stool for 3 weeks with associated with dark-colored emesis.  Past medical history includes hepatitis C, hypertension, stroke, "thyroid disease".  She drinks alcohol but states not much.  Hypotensive via EMS.        Past Medical History:  Diagnosis Date  . Hepatitis C   . Hypertension   . Stroke ([place])   . Thyroid disease    hyperthyroid    There are no problems to display for this patient.   Past Surgical History:  Procedure Laterality Date  . APPENDECTOMY    . CHOLECYSTECTOMY    . HAND SURGERY       OB History   No obstetric history on file.     History reviewed. No pertinent family history.  Social History   Tobacco Use  . Smoking status: Current Every Day Smoker  . Smokeless tobacco: Never Used  Substance Use Topics  . Alcohol use: Yes  . Drug use: Never    Home Medications Prior to Admission medications   Medication Sig Start Date End Date Taking? Authorizing Provider  carvedilol (COREG) 25 MG tablet Take 25 mg by mouth 2 (two) times daily with a meal.    [provider]  cloNIDine (CATAPRES) 0.2 MG tablet Take 0.2 mg by mouth 2 (two) times daily.    [provider]  diazepam (VALIUM) 10 MG tablet Take 10 mg by mouth every 6 (six) hours as needed for anxiety.    [provider]  EPINEPHrine 0.3 mg/0.3 mL IJ SOAJ injection Inject 0.3 mg into the muscle daily as needed (allergic reaction).    [provider]  levothyroxine (SYNTHROID, LEVOTHROID) 150 MCG tablet Take 150 mcg by mouth daily before breakfast.    [provider]  lisinopril (PRINIVIL,ZESTRIL) 40 MG tablet Take 40 mg by mouth daily.    [provider]  metoCLOPramide (REGLAN) 10 MG  tablet Take 1 tablet (10 mg total) by mouth every 6 (six) hours as needed for nausea (or headache). [id number]   Delora Fuel, MD  oxyCODONE-acetaminophen (PERCOCET/ROXICET) 5-325 MG per tablet Take 1 tablet by mouth every 4 (four) hours as needed for severe pain.    [provider]    Allergies    Demerol [meperidine], Morphine and related, Asa [aspirin], Biaxin [clarithromycin], and Nsaids  Review of Systems   Review of Systems  Unable to perform ROS: Acuity of condition    Physical Exam Updated Vital Signs BP (!) 82/53   Pulse (!) 56   Temp (!) 90.8 F (32.7 C) (Rectal)   Resp 16   Ht 5\' 5"  (1.651 m)   Wt 90.7 kg   SpO2 98%   BMI 33.28 kg/m   Physical Exam Vitals and nursing note reviewed.  Constitutional:      Appearance: She is well-developed.     Comments: Pale, obese  HENT:     Head: Normocephalic and atraumatic.  Eyes:     Conjunctiva/sclera: Conjunctivae normal.  Cardiovascular:     Rate and Rhythm: Normal rate and regular rhythm.  Pulmonary:     Effort: Pulmonary effort is normal.     Breath sounds: Normal breath sounds.  Abdominal:     General: Bowel sounds  are normal.     Palpations: Abdomen is soft.     Comments: Tender epigastrium  Genitourinary:    Comments: Rectal exam: Black stool.  Heme positive Musculoskeletal:        General: Normal range of motion.     Cervical back: Neck supple.  Skin:    General: Skin is warm and dry.  Neurological:     General: No focal deficit present.     Mental Status: She is alert and oriented to person, place, and time.  Psychiatric:     Comments: Flat affect     ED Results / Procedures / Treatments   Labs (all labs ordered are listed, but only abnormal results are displayed) Labs Reviewed  COMPREHENSIVE METABOLIC PANEL - Abnormal; Notable for the following components:      Result Value   Sodium 127 (*)    Chloride 88 (*)    CO2 20 (*)    BUN 50 (*)    Creatinine, Ser 5.79 (*)    Calcium 7.7 (*)      Total Protein 5.9 (*)    Albumin 2.1 (*)    AST 136 (*)    ALT 99 (*)    Total Bilirubin 10.5 (*)    GFR calc non Af Amer 7 (*)    GFR calc Af Amer 8 (*)    Anion gap 19 (*)    All other components within normal limits  CBC - Abnormal; Notable for the following components:   RBC 1.64 (*)    Hemoglobin 6.7 (*)    HCT 17.9 (*)    MCV 109.1 (*)    MCH 40.9 (*)    MCHC 37.4 (*)    RDW 21.1 (*)    Platelets 49 (*)    nRBC 3.4 (*)    All other components within normal limits  POC OCCULT BLOOD, ED - Abnormal; Notable for the following components:   Fecal Occult Bld POSITIVE (*)    All other components within normal limits  RESPIRATORY PANEL BY RT PCR (FLU A&B, COVID)  AMMONIA  ETHANOL  RAPID URINE DRUG SCREEN, HOSP PERFORMED  PROTIME-INR  POC OCCULT BLOOD, ED  TYPE AND SCREEN  PREPARE RBC (CROSSMATCH)  ABO/RH  PREPARE PLATELET PHERESIS    EKG None  Radiology No results found.  Procedures Procedures (including critical care time)  Medications Ordered in ED Medications  octreotide (SANDOSTATIN) 2 mcg/mL load via infusion 50 mcg (50 mcg Intravenous Bolus from Bag 05/28/2019 2037)    And  octreotide (SANDOSTATIN) 500 mcg in sodium chloride 0.9 % 250 mL (2 mcg/mL) infusion (50 mcg/hr Intravenous New Bag/Given 06/04/2019 2037)  0.9 %  sodium chloride infusion (Manually program via Guardrails IV Fluids) ( Intravenous Not Given 05/23/2019 2056)  sodium chloride 0.9 % bolus 1,000 mL (0 mLs Intravenous Stopped 06/16/2019 2110)  sodium chloride 0.9 % bolus 1,000 mL (0 mLs Intravenous Stopped 05/29/2019 2110)  pantoprazole (PROTONIX) injection 40 mg (40 mg Intravenous Given 05/27/2019 1940)  0.9 %  sodium chloride infusion (Manually program via Guardrails IV Fluids) ( Intravenous Stopped 05/28/2019 2110)    ED Course  I have reviewed the triage vital signs and the nursing notes.  Pertinent labs & imaging results that were available during my care of the patient were reviewed by me and  considered in my medical decision making (see chart for details).    MDM Rules/Calculators/A&P  History and physical consistent with upper GI bleed.  IV fluids, IV Protonix, Zofran, octreotide.  Discuss with gastroenterology.  Admit to general medicine.  2045: Multiple metabolic anomalies.  Hemoglobin 6.7.  Platelets 49.  Sodium 127.  Creatinine 5.79.  Total bili was 10.5.  Will transfuse blood and platelets.   CRITICAL CARE Performed by: Nat Christen Total critical care time: 75 minutes Critical care time was exclusive of separately billable procedures and treating other patients. Critical care was necessary to treat or prevent imminent or life-threatening deterioration. Critical care was time spent personally by me on the following activities: development of treatment plan with patient and/or surrogate as well as nursing, discussions with consultants, evaluation of patient's response to treatment, examination of patient, obtaining history from patient or surrogate, ordering and performing treatments and interventions, ordering and review of laboratory studies, ordering and review of radiographic studies, pulse oximetry and re-evaluation of patient's condition. Final Clinical Impression(s) / ED Diagnoses Final diagnoses:  UGIB (upper gastrointestinal bleed)  AKI (acute kidney injury) ([place])  Anemia, unspecified type  Thrombocytopenia ([place])  Elevated bilirubin    Rx / DC Orders ED Discharge Orders    None       Nat Christen, MD 06/08/2019 Langston Reusing    Nat Christen, MD 06/06/2019 Lona Kettle    Nat Christen, MD 06/15/2019 2054    Nat Christen, MD 06/02/19 1043

## 2019-06-01 NOTE — ED Triage Notes (Signed)
Pt with rectal bleeding, diarrhea, and hematemesis x 3 weeks. Per EMS, pt very weak, sbp was 50's, and pt was bradycardic in 50's.  EMS started IV and started 500cc NS bolus.

## 2019-06-01 NOTE — ED Notes (Signed)
Date and time results received: 06/02/2019 2003   Test: Hgb Critical Value: 6.7  Name of Provider Notified: Lacinda Axon, MD

## 2019-06-02 ENCOUNTER — Inpatient Hospital Stay (HOSPITAL_COMMUNITY): Payer: 59

## 2019-06-02 DIAGNOSIS — K7290 Hepatic failure, unspecified without coma: Secondary | ICD-10-CM

## 2019-06-02 DIAGNOSIS — D689 Coagulation defect, unspecified: Secondary | ICD-10-CM

## 2019-06-02 DIAGNOSIS — K767 Hepatorenal syndrome: Secondary | ICD-10-CM

## 2019-06-02 DIAGNOSIS — K922 Gastrointestinal hemorrhage, unspecified: Secondary | ICD-10-CM | POA: Diagnosis present

## 2019-06-02 DIAGNOSIS — K7201 Acute and subacute hepatic failure with coma: Secondary | ICD-10-CM

## 2019-06-02 DIAGNOSIS — E8729 Other acidosis: Secondary | ICD-10-CM

## 2019-06-02 DIAGNOSIS — K7682 Hepatic encephalopathy: Secondary | ICD-10-CM

## 2019-06-02 DIAGNOSIS — E872 Acidosis: Secondary | ICD-10-CM

## 2019-06-02 DIAGNOSIS — R892 Abnormal level of other drugs, medicaments and biological substances in specimens from other organs, systems and tissues: Secondary | ICD-10-CM

## 2019-06-02 DIAGNOSIS — R9431 Abnormal electrocardiogram [ECG] [EKG]: Secondary | ICD-10-CM

## 2019-06-02 LAB — CBC WITH DIFFERENTIAL/PLATELET
Abs Immature Granulocytes: 0.04 10*3/uL (ref 0.00–0.07)
Basophils Absolute: 0 10*3/uL (ref 0.0–0.1)
Basophils Relative: 0 %
Eosinophils Absolute: 0 10*3/uL (ref 0.0–0.5)
Eosinophils Relative: 0 %
HCT: 21.7 % — ABNORMAL LOW (ref 36.0–46.0)
Hemoglobin: 8 g/dL — ABNORMAL LOW (ref 12.0–15.0)
Immature Granulocytes: 1 %
Lymphocytes Relative: 13 %
Lymphs Abs: 0.8 10*3/uL (ref 0.7–4.0)
MCH: 35.7 pg — ABNORMAL HIGH (ref 26.0–34.0)
MCHC: 36.9 g/dL — ABNORMAL HIGH (ref 30.0–36.0)
MCV: 96.9 fL (ref 80.0–100.0)
Monocytes Absolute: 0.4 10*3/uL (ref 0.1–1.0)
Monocytes Relative: 6 %
Neutro Abs: 5 10*3/uL (ref 1.7–7.7)
Neutrophils Relative %: 80 %
Platelets: 74 10*3/uL — ABNORMAL LOW (ref 150–400)
RBC: 2.24 MIL/uL — ABNORMAL LOW (ref 3.87–5.11)
RDW: 21.6 % — ABNORMAL HIGH (ref 11.5–15.5)
WBC: 6.3 10*3/uL (ref 4.0–10.5)
nRBC: 2.9 % — ABNORMAL HIGH (ref 0.0–0.2)

## 2019-06-02 LAB — PREPARE PLATELET PHERESIS: Unit division: 0

## 2019-06-02 LAB — COMPREHENSIVE METABOLIC PANEL
ALT: 100 U/L — ABNORMAL HIGH (ref 0–44)
AST: 131 U/L — ABNORMAL HIGH (ref 15–41)
Albumin: 2.1 g/dL — ABNORMAL LOW (ref 3.5–5.0)
Alkaline Phosphatase: 84 U/L (ref 38–126)
Anion gap: 17 — ABNORMAL HIGH (ref 5–15)
BUN: 46 mg/dL — ABNORMAL HIGH (ref 8–23)
CO2: 18 mmol/L — ABNORMAL LOW (ref 22–32)
Calcium: 7.4 mg/dL — ABNORMAL LOW (ref 8.9–10.3)
Chloride: 93 mmol/L — ABNORMAL LOW (ref 98–111)
Creatinine, Ser: 5.9 mg/dL — ABNORMAL HIGH (ref 0.44–1.00)
GFR calc Af Amer: 8 mL/min — ABNORMAL LOW (ref >60)
GFR calc non Af Amer: 7 mL/min — ABNORMAL LOW (ref >60)
Glucose, Bld: 116 mg/dL — ABNORMAL HIGH (ref 70–99)
Potassium: 5 mmol/L (ref 3.5–5.1)
Sodium: 128 mmol/L — ABNORMAL LOW (ref 135–145)
Total Bilirubin: 10.6 mg/dL — ABNORMAL HIGH (ref 0.3–1.2)
Total Protein: 5.9 g/dL — ABNORMAL LOW (ref 6.5–8.1)

## 2019-06-02 LAB — BPAM PLATELET PHERESIS
Blood Product Expiration Date: 202102122359
ISSUE DATE / TIME: 202102102313
Unit Type and Rh: 5100

## 2019-06-02 LAB — CBC
HCT: 20.5 % — ABNORMAL LOW (ref 36.0–46.0)
HCT: 18.4 % — ABNORMAL LOW (ref 36.0–46.0)
Hemoglobin: 7.5 g/dL — ABNORMAL LOW (ref 12.0–15.0)
Hemoglobin: 6.8 g/dL — CL (ref 12.0–15.0)
MCH: 38.7 pg — ABNORMAL HIGH (ref 26.0–34.0)
MCH: 38.6 pg — ABNORMAL HIGH (ref 26.0–34.0)
MCHC: 36.6 g/dL — ABNORMAL HIGH (ref 30.0–36.0)
MCHC: 37 g/dL — ABNORMAL HIGH (ref 30.0–36.0)
MCV: 105.7 fL — ABNORMAL HIGH (ref 80.0–100.0)
MCV: 104.5 fL — ABNORMAL HIGH (ref 80.0–100.0)
Platelets: 79 10*3/uL — ABNORMAL LOW (ref 150–400)
Platelets: 88 10*3/uL — ABNORMAL LOW (ref 150–400)
RBC: 1.94 MIL/uL — ABNORMAL LOW (ref 3.87–5.11)
RBC: 1.76 MIL/uL — ABNORMAL LOW (ref 3.87–5.11)
RDW: 19.9 % — ABNORMAL HIGH (ref 11.5–15.5)
RDW: 20.1 % — ABNORMAL HIGH (ref 11.5–15.5)
WBC: 6.9 10*3/uL (ref 4.0–10.5)
WBC: 6.2 10*3/uL (ref 4.0–10.5)
nRBC: 5.5 % — ABNORMAL HIGH (ref 0.0–0.2)
nRBC: 5.7 % — ABNORMAL HIGH (ref 0.0–0.2)

## 2019-06-02 LAB — URINALYSIS, ROUTINE W REFLEX MICROSCOPIC
Glucose, UA: NEGATIVE mg/dL
Ketones, ur: NEGATIVE mg/dL
Nitrite: NEGATIVE
Protein, ur: 100 mg/dL — AB
RBC / HPF: >50 RBC/hpf — ABNORMAL HIGH (ref 0–5)
Specific Gravity, Urine: 1.023 (ref 1.005–1.030)
pH: 5 (ref 5.0–8.0)

## 2019-06-02 LAB — GLUCOSE, CAPILLARY
Glucose-Capillary: 111 mg/dL — ABNORMAL HIGH (ref 70–99)
Glucose-Capillary: 140 mg/dL — ABNORMAL HIGH (ref 70–99)
Glucose-Capillary: 151 mg/dL — ABNORMAL HIGH (ref 70–99)
Glucose-Capillary: 211 mg/dL — ABNORMAL HIGH (ref 70–99)
Glucose-Capillary: 226 mg/dL — ABNORMAL HIGH (ref 70–99)
Glucose-Capillary: 228 mg/dL — ABNORMAL HIGH (ref 70–99)

## 2019-06-02 LAB — PROTIME-INR
INR: 4.1 (ref 0.8–1.2)
INR: 2.6 — ABNORMAL HIGH (ref 0.8–1.2)
Prothrombin Time: 40 seconds — ABNORMAL HIGH (ref 11.4–15.2)
Prothrombin Time: 27.7 seconds — ABNORMAL HIGH (ref 11.4–15.2)

## 2019-06-02 LAB — COMPREHENSIVE METABOLIC PANEL WITH GFR
ALT: 103 U/L — ABNORMAL HIGH (ref 0–44)
AST: 138 U/L — ABNORMAL HIGH (ref 15–41)
Albumin: 3 g/dL — ABNORMAL LOW (ref 3.5–5.0)
Alkaline Phosphatase: 82 U/L (ref 38–126)
Anion gap: 18 — ABNORMAL HIGH (ref 5–15)
BUN: 48 mg/dL — ABNORMAL HIGH (ref 8–23)
CO2: 22 mmol/L (ref 22–32)
Calcium: 8 mg/dL — ABNORMAL LOW (ref 8.9–10.3)
Chloride: 90 mmol/L — ABNORMAL LOW (ref 98–111)
Creatinine, Ser: 5.64 mg/dL — ABNORMAL HIGH (ref 0.44–1.00)
GFR calc Af Amer: 9 mL/min — ABNORMAL LOW
GFR calc non Af Amer: 7 mL/min — ABNORMAL LOW
Glucose, Bld: 239 mg/dL — ABNORMAL HIGH (ref 70–99)
Potassium: 4.7 mmol/L (ref 3.5–5.1)
Sodium: 130 mmol/L — ABNORMAL LOW (ref 135–145)
Total Bilirubin: 11.8 mg/dL — ABNORMAL HIGH (ref 0.3–1.2)
Total Protein: 6.9 g/dL (ref 6.5–8.1)

## 2019-06-02 LAB — MRSA PCR SCREENING: MRSA by PCR: NEGATIVE

## 2019-06-02 LAB — AMMONIA
Ammonia: 143 umol/L — ABNORMAL HIGH (ref 9–35)
Ammonia: 133 umol/L — ABNORMAL HIGH (ref 9–35)

## 2019-06-02 LAB — VITAMIN B12: Vitamin B-12: 2000 pg/mL — ABNORMAL HIGH (ref 180–914)

## 2019-06-02 LAB — SODIUM, URINE, RANDOM: Sodium, Ur: <10 mmol/L

## 2019-06-02 LAB — FIBRINOGEN
Fibrinogen: 75 mg/dL — CL (ref 210–475)
Fibrinogen: 166 mg/dL — ABNORMAL LOW (ref 210–475)

## 2019-06-02 LAB — ABO/RH: ABO/RH(D): O POS

## 2019-06-02 LAB — APTT: aPTT: 56 seconds — ABNORMAL HIGH (ref 24–36)

## 2019-06-02 LAB — PREPARE RBC (CROSSMATCH)

## 2019-06-02 LAB — CREATININE, URINE, RANDOM: Creatinine, Urine: 457.3 mg/dL

## 2019-06-02 LAB — MAGNESIUM: Magnesium: 1 mg/dL — ABNORMAL LOW (ref 1.7–2.4)

## 2019-06-02 LAB — HIV ANTIBODY (ROUTINE TESTING W REFLEX): HIV Screen 4th Generation wRfx: NONREACTIVE

## 2019-06-02 MED ORDER — LACTATED RINGERS IV BOLUS
1000.0000 mL | Freq: Once | INTRAVENOUS | Status: AC
  Administered 2019-06-02: 03:00:00 1000 mL via INTRAVENOUS

## 2019-06-02 MED ORDER — SODIUM CHLORIDE 0.9% IV SOLUTION: Freq: Once | INTRAVENOUS | Status: DC

## 2019-06-02 MED ORDER — FOLIC ACID 5 MG/ML IJ SOLN
1.0000 mg | Freq: Every day | INTRAMUSCULAR | Status: DC
  Administered 2019-06-02: 1 mg via INTRAVENOUS
  Filled 2019-06-02 (×2): qty 0.2

## 2019-06-02 MED ORDER — MAGNESIUM SULFATE 2 GM/50ML IV SOLN
2.0000 g | Freq: Once | INTRAVENOUS | Status: AC
  Administered 2019-06-02: 2 g via INTRAVENOUS
  Filled 2019-06-02: qty 50

## 2019-06-02 MED ORDER — NOREPINEPHRINE 4 MG/250ML-% IV SOLN
INTRAVENOUS | Status: AC
  Filled 2019-06-02: qty 250

## 2019-06-02 MED ORDER — LACTATED RINGERS IV BOLUS
1000.0000 mL | Freq: Once | INTRAVENOUS | Status: AC
  Administered 2019-06-02: 04:00:00 1000 mL via INTRAVENOUS

## 2019-06-02 MED ORDER — SODIUM CHLORIDE 0.9 % IV SOLN
1.0000 mg | Freq: Once | INTRAVENOUS | Status: DC
  Filled 2019-06-02: qty 0.2

## 2019-06-02 MED ORDER — ALBUMIN HUMAN 25 % IV SOLN
25.0000 g | Freq: Once | INTRAVENOUS | Status: AC
  Administered 2019-06-02: 04:00:00 25 g via INTRAVENOUS
  Filled 2019-06-02: qty 50

## 2019-06-02 MED ORDER — SODIUM CHLORIDE 0.9% IV SOLUTION: Freq: Once | INTRAVENOUS | Status: AC

## 2019-06-02 MED ORDER — ALBUMIN HUMAN 25 % IV SOLN
25.0000 g | Freq: Four times a day (QID) | INTRAVENOUS | Status: AC
  Administered 2019-06-02 – 2019-06-03 (×2): 25 g via INTRAVENOUS
  Filled 2019-06-02: qty 50
  Filled 2019-06-02: qty 100

## 2019-06-02 MED ORDER — ENSURE ENLIVE PO LIQD
237.0000 mL | Freq: Two times a day (BID) | ORAL | Status: DC
  Filled 2019-06-02 (×2): qty 237

## 2019-06-02 MED ORDER — SODIUM CHLORIDE 0.9 % IV SOLN: 250.0000 mL | INTRAVENOUS | Status: DC

## 2019-06-02 MED ORDER — PHYTONADIONE 5 MG PO TABS
2.5000 mg | ORAL_TABLET | Freq: Once | ORAL | Status: AC
  Administered 2019-06-02: 05:00:00 2.5 mg via ORAL
  Filled 2019-06-02: qty 1

## 2019-06-02 MED ORDER — FOLIC ACID 1 MG PO TABS: 1.0000 mg | ORAL_TABLET | Freq: Every day | ORAL | Status: DC

## 2019-06-02 MED ORDER — THIAMINE HCL 100 MG PO TABS: 100.0000 mg | ORAL_TABLET | Freq: Every day | ORAL | Status: DC

## 2019-06-02 MED ORDER — LACTATED RINGERS IV SOLN: INTRAVENOUS | Status: DC

## 2019-06-02 MED ORDER — FUROSEMIDE 10 MG/ML IJ SOLN
100.0000 mg | Freq: Once | INTRAVENOUS | Status: AC
  Administered 2019-06-02: 100 mg via INTRAVENOUS
  Filled 2019-06-02: qty 10

## 2019-06-02 MED ORDER — PANTOPRAZOLE SODIUM 40 MG IV SOLR
INTRAVENOUS | Status: AC
  Filled 2019-06-02: qty 160

## 2019-06-02 MED ORDER — SODIUM CHLORIDE 0.9 % IV SOLN
2.0000 g | INTRAVENOUS | Status: DC
  Filled 2019-06-02: qty 20

## 2019-06-02 MED ORDER — CALCIUM GLUCONATE-NACL 2-0.675 GM/100ML-% IV SOLN
2.0000 g | Freq: Once | INTRAVENOUS | Status: AC
  Administered 2019-06-02: 2000 mg via INTRAVENOUS
  Filled 2019-06-02: qty 100

## 2019-06-02 MED ORDER — METHYLPREDNISOLONE SODIUM SUCC 125 MG IJ SOLR
40.0000 mg | Freq: Every day | INTRAMUSCULAR | Status: DC
  Administered 2019-06-02: 06:00:00 40 mg via INTRAVENOUS
  Filled 2019-06-02: qty 2

## 2019-06-02 MED ORDER — ADULT MULTIVITAMIN W/MINERALS CH: 1.0000 | ORAL_TABLET | Freq: Every day | ORAL | Status: DC

## 2019-06-02 MED ORDER — SODIUM CHLORIDE 0.9 % IV SOLN
2.0000 g | INTRAVENOUS | Status: DC
  Administered 2019-06-02 – 2019-06-03 (×2): 2 g via INTRAVENOUS
  Filled 2019-06-02: qty 20

## 2019-06-02 MED ORDER — SODIUM BICARBONATE-DEXTROSE 150-5 MEQ/L-% IV SOLN
150.0000 meq | INTRAVENOUS | Status: DC
  Administered 2019-06-02 (×2): 150 meq via INTRAVENOUS
  Filled 2019-06-02 (×2): qty 1000

## 2019-06-02 MED ORDER — CHLORHEXIDINE GLUCONATE CLOTH 2 % EX PADS: 6.0000 | MEDICATED_PAD | Freq: Every day | CUTANEOUS | Status: DC

## 2019-06-02 MED ORDER — ALBUMIN HUMAN 5 % IV SOLN
25.0000 g | Freq: Once | INTRAVENOUS | Status: AC
  Administered 2019-06-02: 04:00:00 25 g via INTRAVENOUS
  Filled 2019-06-02: qty 250

## 2019-06-02 MED ORDER — THIAMINE HCL 100 MG/ML IJ SOLN
100.0000 mg | Freq: Every day | INTRAMUSCULAR | Status: DC
  Administered 2019-06-02: 10:00:00 100 mg via INTRAVENOUS
  Filled 2019-06-02: qty 2

## 2019-06-02 MED ORDER — RIFAXIMIN 550 MG PO TABS
550.0000 mg | ORAL_TABLET | Freq: Two times a day (BID) | ORAL | Status: DC
  Administered 2019-06-02 (×2): 550 mg via ORAL
  Filled 2019-06-02 (×2): qty 1

## 2019-06-02 MED ORDER — NOREPINEPHRINE 4 MG/250ML-% IV SOLN
2.0000 ug/min | INTRAVENOUS | Status: DC
  Administered 2019-06-02: 03:00:00 5 ug/min via INTRAVENOUS
  Administered 2019-06-02: 10:00:00 10 ug/min via INTRAVENOUS
  Administered 2019-06-03: 04:00:00 4 ug/min via INTRAVENOUS
  Filled 2019-06-02 (×2): qty 250

## 2019-06-02 NOTE — Progress Notes (Signed)
NAME:  Cindy Hubbard, MRN:  [id number], DOB:  [birth year], LOS: 1 ADMISSION DATE:  06/15/2019, CONSULTATION DATE:  06/02/19 CHIEF COMPLAINT:  Upper GI bleed   Brief History   Cindy Hubbard is a 64 yo F w/ PMHx of BRCA2 +, HTN, spinal stenosis, hypothyroidism, chronic Hep C and ETOH use w/ resultant Cirrhosis  who presented to [facility] for a 3 week h/o upper GI bleed. Her last alcohol consumption was 2 weeks prior to admission.  In the ED, she was hypotensive and hypothermic.  History of present illness   Coffee ground emesis reportedly began 3 weeks prior to presentation, with concomitant dark tarry stools.  She has history of alcohol abuse, but she stated that last alcohol consumption was about 2 weeks ago.  Patient complained of increased tiredness and weakness, so she came to the ED for further evaluation.   In ED, patient noted to be hypotensive, hypothermic, with acute anemia Hgb 6.7, plt 49, TBili 10.5,m mild transaminitis, Ammonia 130. Cr 5.79, BUN 50. Was given 1 PRBC and 1 plt at [facility].  ECG with prolonged qtc 502, Sinus bradycardia   Past Medical History  HTN, spinal stenosis, hypothyroidism, chronic Hep C, EtOH use w/ resultant cirrhosis, [facility]   2/11 admitted to ICU  Consults:  -GI -nephrology  Procedures:    Significant Diagnostic Tests:  EKG - QT prolongation  Micro Data:  2/11 MRSA neg 2/11 Covid neg, Influenza A & B neg  Antimicrobials:  CTX 2/11 - (for SBP ppx)   Interim history/subjective:  Temperatures increased to 95.9 overnight.  Patient remained hypotensive with HR in the 70s.  Objective   Blood pressure 107/61, pulse 75, temperature (!) 97.3 F (36.3 C), temperature source Bladder, resp. rate 14, height 5' 5"  (1.651 m), weight 92 kg, SpO2 93 %.        Intake/Output Summary (Last 24 hours) at 06/02/2019 9169 Last data filed at 06/02/2019 4503 Gross per 24 hour  Intake 8220.84 ml  Output 370 ml  Net 7850.84 ml   Filed Weights   06/17/2019 1913 06/02/19 0500  Weight: 90.7 kg 92 kg    Examination: General: jaundiced, chronically ill appearing woman HENT: trachea midline, sclera icteric Lungs: lung sounds distant but CTAB Cardiovascular: RRR, no murmurs appreciated Abdomen: soft, non-tender, mildly distended Extremities: warm, dry Neuro: oriented to person, place, but not time; appears very confused Skin: bruising on L lower leg and L chest  Resolved Hospital Problem list     Assessment & Plan:  Hemorrhagic Shock 2/2 Acute GI bleed: HGB 6.7 on admission.  She has received 1 unit pRBCs, 1 unit platelets, 1 unit plasma.  Hemoccult was positive.  Currently NPO. -Maintain active type and crossmatch -levophed with MAP goal >65 mmHg -Transfuse for HGB <7.0 -Continue IV Protonix drip -Continue IV octreotide drip -Consult GI  Acute kidney injury on ?? CKD 5: Creatinine on admission 5.79 (no recent labs for comparison of baseline creatinine-last creatinine on record was 0.6 about 4 years ago).  Oliguric - per nurse only 10-46m output this morning. -Nephrology consult -Renally adjust medications -Avoid nephrotoxic agents  Hyperammonemia, transaminitis, coagulopathy likely 2/2 patient's history of chronic Hep C, EtOH abuse, & Cirrhosis: On admission, ammonia level of 130, AST 136, ALT 99.  INR 5.8.  MELD score of 40 indicates poor prognosis and 71.3% 3 month mortality. -Daily CMP -Continue on lactulose -Continue on rifaximin -Continue to monitor ammonia level -Rocephin (PPX for SBP)  Acute Encephalopathy: AAOx2.  Multiple reasons for AMS including hepatic encephalopathy, uremia, h/o EtOH use -Management as above  Hyponatremia: Na 127, IV hydration provided in the ED.  Urine Na <10. -F/u urine osmolality -Daily CMP  EtOH Abuse, macrocytic anemia: No alcohol use for 2 weeks per patient, on admission EtOH <10 -F/u B12 -Check thiamine -Check folate -Folate, thiamine supplements  Prolonged QTc: Mg level 1.0  this morning (2/11).  Repeat EKG with QTc 483m. -Avoid QT prolonging drugs -electrolyte repletion  Thrombocytopenia: Platelets 49, posiibly to GI Bleed and Chronic Hep C.  Pt received 1 unit of platelets. -Daily CBC  Hypoalbuminemia 2/2 moderate protein calorie malnutrition: Albumin 2.1 -protein supplement  Hypothermia: Temp on arrival 90.8  Improving. -Bair Hugger provided  Hypothyroidism -Continue Synthroid per home regimen  Acute Hypotension, Chronic Essential hypertension -Hold BP meds at this time due to hypotension  Nonspecific abnormal toxicology: Urine drug screen was positive for benzodiazepine, but home meds include diazepam  Best practice:  Diet: NPO Pain/Anxiety/Delirium protocol (if indicated): n/a VAP protocol (if indicated): n/a DVT prophylaxis: SCD GI prophylaxis: protonix gtt Glucose control: monitor Mobility: BR Code Status: Full Family Communication: family communication pending Disposition: ICU  Labs   CBC: Recent Labs  Lab 06/16/2019 1918 06/02/19 0257  WBC 8.0 6.9  HGB 6.7* 7.5*  HCT 17.9* 20.5*  MCV 109.1* 105.7*  PLT 49* 79*    Basic Metabolic Panel: Recent Labs  Lab 06/06/2019 1918 06/02/19 0257  NA 127* 128*  K 4.8 5.0  CL 88* 93*  CO2 20* 18*  GLUCOSE 96 116*  BUN 50* 46*  CREATININE 5.79* 5.90*  CALCIUM 7.7* 7.4*  MG  --  1.0*   GFR: Estimated Creatinine Clearance: 11.1 mL/min (A) (by C-G formula based on SCr of 5.9 mg/dL (H)). Recent Labs  Lab 06/10/2019 1918 06/02/19 0257  WBC 8.0 6.9    Liver Function Tests: Recent Labs  Lab 06/12/2019 1918 06/02/19 0257  AST 136* 131*  ALT 99* 100*  ALKPHOS 80 84  BILITOT 10.5* 10.6*  PROT 5.9* 5.9*  ALBUMIN 2.1* 2.1*   No results for input(s): LIPASE, AMYLASE in the last 168 hours. Recent Labs  Lab 05/31/2019 2139 06/02/19 0611  AMMONIA 130* 143*    ABG No results found for: PHART, PCO2ART, PO2ART, HCO3, TCO2, ACIDBASEDEF, O2SAT   Coagulation Profile: Recent Labs   Lab 05/31/2019 1918 06/02/19 0257  INR 5.8* 4.1*    Cardiac Enzymes: No results for input(s): CKTOTAL, CKMB, CKMBINDEX, TROPONINI in the last 168 hours.  HbA1C: No results found for: HGBA1C  CBG: Recent Labs  Lab 06/02/19 0328 06/02/19 0821  GLUCAP 111* 140*    Review of Systems:   Pt denies belly pain.  Past Medical History  She,  has a past medical history of Hepatitis C, Hypertension, Stroke ([place], and Thyroid disease.   Surgical History    Past Surgical History:  Procedure Laterality Date  . APPENDECTOMY    . CHOLECYSTECTOMY    . HAND SURGERY       Social History   reports that she has been smoking. She has never used smokeless tobacco. She reports current alcohol use. She reports that she does not use drugs.   Family History   Her family history is not on file.   Allergies Allergies  Allergen Reactions  . Demerol [Meperidine] Anaphylaxis  . Morphine And Related Anaphylaxis  . Asa [Aspirin] Other (See Comments)    bleeding  . Biaxin [Clarithromycin] Other (See Comments)    unknown  .  Nsaids Nausea And Vomiting     Home Medications  Prior to Admission medications   Medication Sig Start Date End Date Taking? Authorizing Provider  carvedilol (COREG) 25 MG tablet Take 25 mg by mouth 2 (two) times daily with a meal.    [provider]  cloNIDine (CATAPRES) 0.2 MG tablet Take 0.2 mg by mouth 2 (two) times daily.    [provider]  diazepam (VALIUM) 10 MG tablet Take 10 mg by mouth every 6 (six) hours as needed for anxiety.    [provider]  EPINEPHrine 0.3 mg/0.3 mL IJ SOAJ injection Inject 0.3 mg into the muscle daily as needed (allergic reaction).    [provider]  levothyroxine (SYNTHROID, LEVOTHROID) 150 MCG tablet Take 150 mcg by mouth daily before breakfast.    [provider]  lisinopril (PRINIVIL,ZESTRIL) 40 MG tablet Take 40 mg by mouth daily.    [provider]  metoCLOPramide (REGLAN) 10  MG tablet Take 1 tablet (10 mg total) by mouth every 6 (six) hours as needed for nausea (or headache). 10/21/40   Delora Fuel, MD  oxyCODONE-acetaminophen (PERCOCET/ROXICET) 5-325 MG per tablet Take 1 tablet by mouth every 4 (four) hours as needed for severe pain.    [provider]     Critical care time: 40 minutes     Mallie Darting, MS4

## 2019-06-02 NOTE — H&P (Signed)
NAMESelina Hubbard, MRN:  [id number], DOB:  August 11, 1955, LOS: 1 ADMISSION DATE:  06/17/2019, CONSULTATION DATE:  06/02/19 REFERRING MD:  Josephine Cables - [facility], CHIEF COMPLAINT:  GIB   Brief History   64 yo F with hx Hep C, presents with acute GIB. Transferred to [facility] from [facility]   History of present illness   64 yo F PMH Hep C, ETOH abuse,hypothyroidism, prior CVA who presented to [facility] [place] 2/10 with Coffee ground emesis. Coffee ground emesis reportedly began 3 weeks prior to presentation, with concomitant dark tarry stools. In ED, patient noted to be hypotensive, hypothermic, with acute anemia Hgb 6.7, plt 49, TBili 10.5,m mild transaminitis, Ammonia 130. Cr 5.79, BUN 50. Was given 1 PRBC and 1 plt at [facility].  ECG with prolonged qtc 502, Sinus bradycardia   Past Medical History  Hep C EtOH abuse Hypothyroidism  CVA   Significant Hospital Events   2/11 Transferred to [facility] ICU from [place]:  Will need GI consult   Procedures:   Significant Diagnostic Tests:    Micro Data:  2/10 SARS Cov2> neg  Antimicrobials:  2/11 Ceftriaxone>> (SBP ppx)   Interim history/subjective:  Arrives hypotensive, NAD   Objective   Blood pressure 96/77, pulse 61, temperature (!) 95.4 F (35.2 C), temperature source Oral, resp. rate (!) 21, height 5\' 5"  (1.651 m), weight 90.7 kg, SpO2 97 %.        Intake/Output Summary (Last 24 hours) at 06/02/2019 0246 Last data filed at 06/02/2019 0243 Gross per 24 hour  Intake 3280.07 ml  Output --  Net 3280.07 ml   Filed Weights   06/18/2019 1913  Weight: 90.7 kg    Examination: General: Chronically ill appearing middle aged F, supine in bed NAD  HENT: NCAT. Pink tacky mm. Mild glossitis. Icteric sclera. Lungs: CTA Symmetrical chest expansion, no accessory muscle use  Cardiovascular: Sinus bradycardia. S1s2, Cap refill 3 seconds. No JVD  Abdomen: Protuberant, mildly tympanic. Soft, non-tender.  Extremities: Symmetrical bulk and tone. No obvious joint deformity. No  cyanosis or clubbing  Neuro: Awake, lethargic, oriented x 3. Following commands. PERRL GU: WNL Skin: jaundice. Scattered ecchymosis. No rash. C/d/cool.   Resolved Hospital Problem list     Assessment & Plan:   Shock -favor hypovolemic in setting of acute GIB -no leukocytosis, no fever. Possible  P -Continue volume resuscitation with IVF, blood product.  -Albumin now -Peripheral pressors for MAP goal 65  Acute GIB P -continue protonix, octreotide -Will need GI consult -NPO, sips with meds only  -Ceftriaxone for SBP ppx   Acute Blood Loss anemia -in setting of GIB P -CBC q6hr -s/p 1 PRBC -Hgb goal > 7   Thrombocytopenia -likely in setting of Hep C P -plt ordered  -trend on CBC  Coagulopathy -INR 5.8 P -2 FFP  -2.5 Vit K  -trend coags -likely will need central line after coagulopathy is corrected    Hep C Hyperammonemia Transaminitis Hyperbilirubinemia  -prior cholecystectomy in surgical hx  Concern for ascites  P -Lactulose  -Trend ammonia, LFTs  -Eval for paracentesis; would correct coagulopathy before possible para  -Ceftriaxone for SBP ppx as above  AKI -no recent baseline -admission Cr 5.79, BUN 50  -suspect driven by ongoing hypotension, hypovolemia  P -MAP goal > 65 as above  -Volume resusc as above  -FENa   Acute Encephalopathy -toxic metabolic -likely HE component P -delirium precautions -lactulose as above -correct metabolic abnormalities  -MAP goal as above   Hyponatremia -?  R/t Etoh use vs dehydration  P -trend BMP  -albumin will likely aid in correction   Prolonged qtc P -ICU monitoring -check mag, ionized calcium  -avoid qtc prolonging meds   Hx HTN P -ICU monitoring -holing antiHTN meds in setting of shock   Hypothyroidism P -synthroid    Best practice:  Diet: NPO  Pain/Anxiety/Delirium protocol (if indicated): na VAP protocol (if indicated): na DVT prophylaxis: SCD  GI prophylaxis: protonix gtt    Glucose control: monitor Mobility: BR  Code Status: Full  Family Communication: patient updated, family communication pending   Disposition: ICU   Labs   CBC: Recent Labs  Lab 06/15/2019 1918  WBC 8.0  HGB 6.7*  HCT 17.9*  MCV 109.1*  PLT 49*    Basic Metabolic Panel: Recent Labs  Lab 06/04/2019 1918  NA 127*  K 4.8  CL 88*  CO2 20*  GLUCOSE 96  BUN 50*  CREATININE 5.79*  CALCIUM 7.7*   GFR: Estimated Creatinine Clearance: 11.2 mL/min (A) (by C-G formula based on SCr of 5.79 mg/dL (H)). Recent Labs  Lab 06/19/2019 1918  WBC 8.0    Liver Function Tests: Recent Labs  Lab 06/02/2019 1918  AST 136*  ALT 99*  ALKPHOS 80  BILITOT 10.5*  PROT 5.9*  ALBUMIN 2.1*   No results for input(s): LIPASE, AMYLASE in the last 168 hours. Recent Labs  Lab 05/26/2019 2139  AMMONIA 130*    ABG No results found for: PHART, PCO2ART, PO2ART, HCO3, TCO2, ACIDBASEDEF, O2SAT   Coagulation Profile: Recent Labs  Lab 06/07/2019 1918  INR 5.8*    Cardiac Enzymes: No results for input(s): CKTOTAL, CKMB, CKMBINDEX, TROPONINI in the last 168 hours.  HbA1C: No results found for: HGBA1C  CBG: No results for input(s): GLUCAP in the last 168 hours.  Review of Systems:   Limited due to intermittent confusion  Denies SOB, cough, wheezing Denies chest pain, palpitations, chest pressure, edema  Endorses n/v, coffee ground emesis and tarry stool Endorses fatigue, weakness. Denies fever chills Denies HA, LOC, Dizziness   Past Medical History  She,  has a past medical history of Hepatitis C, Hypertension, Stroke ([place]), and Thyroid disease.   Surgical History    Past Surgical History:  Procedure Laterality Date  . APPENDECTOMY    . CHOLECYSTECTOMY    . HAND SURGERY       Social History   reports that she has been smoking. She has never used smokeless tobacco. She reports current alcohol use. She reports that she does not use drugs.   Family History   Her family history is  not on file.   Allergies Allergies  Allergen Reactions  . Demerol [Meperidine] Anaphylaxis  . Morphine And Related Anaphylaxis  . Asa [Aspirin] Other (See Comments)    bleeding  . Biaxin [Clarithromycin] Other (See Comments)    unknown  . Nsaids Nausea And Vomiting     Home Medications  Prior to Admission medications   Medication Sig Start Date End Date Taking? Authorizing Provider  carvedilol (COREG) 25 MG tablet Take 25 mg by mouth 2 (two) times daily with a meal.    [provider]  cloNIDine (CATAPRES) 0.2 MG tablet Take 0.2 mg by mouth 2 (two) times daily.    [provider]  diazepam (VALIUM) 10 MG tablet Take 10 mg by mouth every 6 (six) hours as needed for anxiety.    [provider]  EPINEPHrine 0.3 mg/0.3 mL IJ SOAJ injection Inject 0.3 mg  into the muscle daily as needed (allergic reaction).    [provider]  levothyroxine (SYNTHROID, LEVOTHROID) 150 MCG tablet Take 150 mcg by mouth daily before breakfast.    [provider]  lisinopril (PRINIVIL,ZESTRIL) 40 MG tablet Take 40 mg by mouth daily.    [provider]  metoCLOPramide (REGLAN) 10 MG tablet Take 1 tablet (10 mg total) by mouth every 6 (six) hours as needed for nausea (or headache). [id number]   Delora Fuel, MD  oxyCODONE-acetaminophen (PERCOCET/ROXICET) 5-325 MG per tablet Take 1 tablet by mouth every 4 (four) hours as needed for severe pain.    [provider]     Critical care time: 55 minutes    Eliseo Gum MSN, AGACNP-BC [place] [phone] If no answer, [phone] 06/02/2019, 3:59 AM

## 2019-06-02 NOTE — Plan of Care (Signed)

## 2019-06-02 NOTE — Progress Notes (Signed)
CRITICAL VALUE ALERT  Critical Value:  INR 4.1 and Fibrinogen 75  Date & Time Notied:  06/02/2019 @ 0410  Provider Notified: Shirlee Limerick, NP  Orders Received/Actions taken: NP aware

## 2019-06-02 NOTE — Consult Note (Signed)
Reason for Consult: GI bleed, Decompensated cirrhosis Referring Physician: CCM  [place] HPI: This is a 64 year old female with a PMH of HCV and ETOH cirrhosis who presented to [facility] ER with coffee-ground emesis, hypotension, hypothermia, and severe coagulopathy.  Her history is obtained from the chart as she is encephalopathic.  Per the chart she was experiencing coffee-ground emesis for the prior 3 weeks and it was associated with melenic stools.  The initial blood work showed an anemia of 6.7 g/dL and thrombocytopenia at 49,000.  She was also noted to be in renal failure and her TB is elevated at 10.5.  The calculated MELD was at 40.  GI was consulted to further evaluate her GI bleeding.  Past Medical History:  Diagnosis Date  . Hepatitis C   . Hypertension   . Stroke ([place])   . Thyroid disease    hyperthyroid    Past Surgical History:  Procedure Laterality Date  . APPENDECTOMY    . CHOLECYSTECTOMY    . HAND SURGERY      History reviewed. No pertinent family history.  Social History:  reports that she has been smoking. She has never used smokeless tobacco. She reports current alcohol use. She reports that she does not use drugs.  Allergies:  Allergies  Allergen Reactions  . Demerol [Meperidine] Anaphylaxis  . Morphine And Related Anaphylaxis  . Asa [Aspirin] Other (See Comments)    bleeding  . Biaxin [Clarithromycin] Other (See Comments)    unknown  . Nsaids Nausea And Vomiting    Medications:  Scheduled: . sodium chloride   Intravenous Once  . sodium chloride   Intravenous Once  . sodium chloride   Intravenous Once  . Chlorhexidine Gluconate Cloth  6 each Topical Daily  . feeding supplement (ENSURE ENLIVE)  237 mL Oral BID BM  . folic acid  1 mg Intravenous Daily  . lactulose  300 mL Rectal BID  . methylPREDNISolone (SOLU-MEDROL) injection  40 mg Intravenous Daily  . multivitamin with minerals  1 tablet Oral Daily  . [START ON 06/05/2019] pantoprazole  40 mg  Intravenous Q12H  . rifaximin  550 mg Oral BID  . thiamine  100 mg Oral Daily   Or  . thiamine  100 mg Intravenous Daily   Continuous: . sodium chloride Stopped (06/02/19 0259)  . albumin human    . cefTRIAXone (ROCEPHIN)  IV 2 g (06/02/19 0525)  . norepinephrine (LEVOPHED) Adult infusion 6 mcg/min (06/02/19 1400)  . octreotide  (SANDOSTATIN)    IV infusion 50 mcg/hr (06/02/19 1400)  . pantoprozole (PROTONIX) infusion Stopped (06/02/19 1052)  . sodium bicarbonate 150 mEq in dextrose 5% 1000 mL 75 mL/hr at 06/02/19 1400    Results for orders placed or performed during the hospital encounter of 06/05/2019 (from the past 24 hour(s))  Comprehensive metabolic panel     Status: Abnormal   Collection Time: 06/11/2019  7:18 PM  Result Value Ref Range   Sodium 127 (L) 135 - 145 mmol/L   Potassium 4.8 3.5 - 5.1 mmol/L   Chloride 88 (L) 98 - 111 mmol/L   CO2 20 (L) 22 - 32 mmol/L   Glucose, Bld 96 70 - 99 mg/dL   BUN 50 (H) 8 - 23 mg/dL   Creatinine, Ser 5.79 (H) 0.44 - 1.00 mg/dL   Calcium 7.7 (L) 8.9 - 10.3 mg/dL   Total Protein 5.9 (L) 6.5 - 8.1 g/dL   Albumin 2.1 (L) 3.5 - 5.0 g/dL  AST 136 (H) 15 - 41 U/L   ALT 99 (H) 0 - 44 U/L   Alkaline Phosphatase 80 38 - 126 U/L   Total Bilirubin 10.5 (H) 0.3 - 1.2 mg/dL   GFR calc non Af Amer 7 (L) >60 mL/min   GFR calc Af Amer 8 (L) >60 mL/min   Anion gap 19 (H) 5 - 15  CBC     Status: Abnormal   Collection Time: 05/24/2019  7:18 PM  Result Value Ref Range   WBC 8.0 4.0 - 10.5 K/uL   RBC 1.64 (L) 3.87 - 5.11 MIL/uL   Hemoglobin 6.7 (LL) 12.0 - 15.0 g/dL   HCT 17.9 (L) 36.0 - 46.0 %   MCV 109.1 (H) 80.0 - 100.0 fL   MCH 40.9 (H) 26.0 - 34.0 pg   MCHC 37.4 (H) 30.0 - 36.0 g/dL   RDW 21.1 (H) 11.5 - 15.5 %   Platelets 49 (L) 150 - 400 K/uL   nRBC 3.4 (H) 0.0 - 0.2 %  Type and screen [place]     Status: None (Preliminary result)   Collection Time: 06/05/2019  7:18 PM  Result Value Ref Range   ABO/RH(D) O POS    Antibody Screen  NEG    Sample Expiration 06/04/2019,2359    Unit Number [id number]    Blood Component Type RED CELLS,LR    Unit division 00    Status of Unit ISSUED,FINAL    Transfusion Status OK TO TRANSFUSE    Crossmatch Result      Compatible Performed at [facility], [address] [place], [place] [address]    Unit Number [id number]    Blood Component Type RED CELLS,LR    Unit division 00    Status of Unit ALLOCATED    Transfusion Status OK TO TRANSFUSE    Crossmatch Result Compatible   ABO/Rh     Status: None   Collection Time: 06/09/2019  7:18 PM  Result Value Ref Range   ABO/RH(D)      O POS Performed at [facility], [address], [place], [place] [address]   Prepare Pheresed Platelets     Status: None   Collection Time: 06/11/2019  7:18 PM  Result Value Ref Range   Unit Number [id number]    Blood Component Type PLTPHER LR1    Unit division 00    Status of Unit ISSUED,FINAL    Transfusion Status OK TO TRANSFUSE   Protime-INR     Status: Abnormal   Collection Time: 06/06/2019  7:18 PM  Result Value Ref Range   Prothrombin Time 52.3 (H) 11.4 - 15.2 seconds   INR 5.8 (HH) 0.8 - 1.2  HIV Antibody (routine testing w rflx)     Status: None   Collection Time: 06/18/2019  7:18 PM  Result Value Ref Range   HIV Screen 4th Generation wRfx NON REACTIVE NON REACTIVE  POC occult blood, ED     Status: Abnormal   Collection Time: 06/10/2019  7:34 PM  Result Value Ref Range   Fecal Occult Bld POSITIVE (A) NEGATIVE  Respiratory Panel by RT PCR (Flu A&B, Covid) - Nasopharyngeal Swab     Status: None   Collection Time: 06/17/2019  7:40 PM   Specimen: Nasopharyngeal Swab  Result Value Ref Range   SARS Coronavirus 2 by RT PCR NEGATIVE NEGATIVE   Influenza A by PCR NEGATIVE NEGATIVE   Influenza B by PCR NEGATIVE NEGATIVE  Prepare RBC     Status: None  Collection Time: 06/12/2019  8:30 PM  Result Value Ref Range   Order Confirmation      ORDER PROCESSED BY BLOOD BANK Performed at [facility], [address], [place], [place] [address]   Urine rapid drug screen (hosp performed)     Status: Abnormal   Collection Time: 06/17/2019  9:19 PM  Result Value Ref Range   Opiates NONE DETECTED NONE DETECTED   Cocaine NONE DETECTED NONE DETECTED   Benzodiazepines POSITIVE (A) NONE DETECTED   Amphetamines NONE DETECTED NONE DETECTED   Tetrahydrocannabinol NONE DETECTED NONE DETECTED   Barbiturates NONE DETECTED NONE DETECTED  Ammonia     Status: Abnormal   Collection Time: 06/05/2019  9:39 PM  Result Value Ref Range   Ammonia 130 (H) 9 - 35 umol/L  Ethanol     Status: None   Collection Time: 05/30/2019  9:39 PM  Result Value Ref Range   Alcohol, Ethyl (B) <10 <10 mg/dL  MRSA PCR Screening     Status: None   Collection Time: 06/02/19  2:38 AM   Specimen: Nasopharyngeal  Result Value Ref Range   MRSA by PCR NEGATIVE NEGATIVE  Prepare fresh frozen plasma     Status: None (Preliminary result)   Collection Time: 06/02/19  2:54 AM  Result Value Ref Range   Unit Number [id number]    Blood Component Type THW PLS APHR    Unit division A0    Status of Unit ISSUED    Transfusion Status      OK TO TRANSFUSE Performed at [facility], [address], [place], [place] [address]    Unit Number [id number]    Blood Component Type THAWED PLASMA    Unit division 00    Status of Unit ISSUED    Transfusion Status OK TO TRANSFUSE   Type and screen [place]     Status: None (Preliminary result)   Collection Time: 06/02/19  2:57 AM  Result Value Ref Range   ABO/RH(D) O POS    Antibody Screen NEG    Sample Expiration 06/05/2019,2359    Unit Number [id number]    Blood Component Type RED CELLS,LR    Unit division 00    Status of Unit ISSUED    Transfusion Status OK TO TRANSFUSE    Crossmatch Result Compatible    Unit Number [id number]    Blood Component Type RED CELLS,LR    Unit division 00    Status of Unit ISSUED    Transfusion Status OK TO TRANSFUSE     Crossmatch Result      Compatible Performed at [facility], [place] [address] [place], [place] [address]   Comprehensive metabolic panel     Status: Abnormal   Collection Time: 06/02/19  2:57 AM  Result Value Ref Range   Sodium 128 (L) 135 - 145 mmol/L   Potassium 5.0 3.5 - 5.1 mmol/L   Chloride 93 (L) 98 - 111 mmol/L   CO2 18 (L) 22 - 32 mmol/L   Glucose, Bld 116 (H) 70 - 99 mg/dL   BUN 46 (H) 8 - 23 mg/dL   Creatinine, Ser 5.90 (H) 0.44 - 1.00 mg/dL   Calcium 7.4 (L) 8.9 - 10.3 mg/dL   Total Protein 5.9 (L) 6.5 - 8.1 g/dL   Albumin 2.1 (L) 3.5 - 5.0 g/dL   AST 131 (H) 15 - 41 U/L   ALT 100 (H) 0 - 44 U/L   Alkaline Phosphatase 84  38 - 126 U/L   Total Bilirubin 10.6 (H) 0.3 - 1.2 mg/dL   GFR calc non Af Amer 7 (L) >60 mL/min   GFR calc Af Amer 8 (L) >60 mL/min   Anion gap 17 (H) 5 - 15  Magnesium     Status: Abnormal   Collection Time: 06/02/19  2:57 AM  Result Value Ref Range   Magnesium 1.0 (L) 1.7 - 2.4 mg/dL  CBC     Status: Abnormal   Collection Time: 06/02/19  2:57 AM  Result Value Ref Range   WBC 6.9 4.0 - 10.5 K/uL   RBC 1.94 (L) 3.87 - 5.11 MIL/uL   Hemoglobin 7.5 (L) 12.0 - 15.0 g/dL   HCT 20.5 (L) 36.0 - 46.0 %   MCV 105.7 (H) 80.0 - 100.0 fL   MCH 38.7 (H) 26.0 - 34.0 pg   MCHC 36.6 (H) 30.0 - 36.0 g/dL   RDW 19.9 (H) 11.5 - 15.5 %   Platelets 79 (L) 150 - 400 K/uL   nRBC 5.5 (H) 0.0 - 0.2 %  Protime-INR     Status: Abnormal   Collection Time: 06/02/19  2:57 AM  Result Value Ref Range   Prothrombin Time 40.0 (H) 11.4 - 15.2 seconds   INR 4.1 (HH) 0.8 - 1.2  APTT     Status: Abnormal   Collection Time: 06/02/19  2:57 AM  Result Value Ref Range   aPTT 56 (H) 24 - 36 seconds  Fibrinogen     Status: Abnormal   Collection Time: 06/02/19  2:57 AM  Result Value Ref Range   Fibrinogen 75 (LL) 210 - 475 mg/dL  ABO/Rh     Status: None   Collection Time: 06/02/19  2:57 AM  Result Value Ref Range   ABO/RH(D)      O POS Performed at [facility], [place] [address] [place], [place] [address]   Glucose, capillary     Status: Abnormal   Collection Time: 06/02/19  3:28 AM  Result Value Ref Range   Glucose-Capillary 111 (H) 70 - 99 mg/dL  Prepare cryoprecipitate     Status: None (Preliminary result)   Collection Time: 06/02/19  4:13 AM  Result Value Ref Range   Unit Number [id number]    Blood Component Type CRYPOOL THAW    Unit division 00    Status of Unit ISSUED    Transfusion Status      OK TO TRANSFUSE Performed at [facility], [place] [address] [place], [place] [address]   Prepare Pheresed Platelets     Status: None (Preliminary result)   Collection Time: 06/02/19  4:48 AM  Result Value Ref Range   Unit Number [id number]    Blood Component Type PLTP LR2 PAS    Unit division 00    Status of Unit ISSUED    Transfusion Status      OK TO TRANSFUSE Performed at [place] [address] [place], [place] [address]   Ammonia     Status: Abnormal   Collection Time: 06/02/19  6:11 AM  Result Value Ref Range   Ammonia 143 (H) 9 - 35 umol/L  Creatinine, urine, random     Status: None   Collection Time: 06/02/19  6:24 AM  Result Value Ref Range   Creatinine, Urine 457.30 mg/dL  Sodium, urine, random     Status: None   Collection Time: 06/02/19  6:24 AM  Result Value Ref Range  Sodium, Ur <10 mmol/L  Glucose, capillary     Status: Abnormal   Collection Time: 06/02/19  8:21 AM  Result Value Ref Range   Glucose-Capillary 140 (H) 70 - 99 mg/dL  CBC     Status: Abnormal   Collection Time: 06/02/19  8:51 AM  Result Value Ref Range   WBC 6.2 4.0 - 10.5 K/uL   RBC 1.76 (L) 3.87 - 5.11 MIL/uL   Hemoglobin 6.8 (LL) 12.0 - 15.0 g/dL   HCT 18.4 (L) 36.0 - 46.0 %   MCV 104.5 (H) 80.0 - 100.0 fL   MCH 38.6 (H) 26.0 - 34.0 pg   MCHC 37.0 (H) 30.0 - 36.0 g/dL   RDW 20.1 (H) 11.5 - 15.5 %   Platelets 88 (L) 150 - 400 K/uL   nRBC 5.7 (H) 0.0 - 0.2 %  Vitamin B12     Status: Abnormal   Collection Time:  06/02/19  8:51 AM  Result Value Ref Range   Vitamin B-12 2,000 (H) 180 - 914 pg/mL  BLOOD TRANSFUSION REPORT - SCANNED     Status: None   Collection Time: 06/02/19 10:07 AM   Narrative   Ordered by an unspecified provider.  Prepare RBC     Status: None   Collection Time: 06/02/19 10:23 AM  Result Value Ref Range   Order Confirmation      ORDER PROCESSED BY BLOOD BANK Performed at [facility], [place] [address], [place], [place] [address]   Prepare fresh frozen plasma     Status: None (Preliminary result)   Collection Time: 06/02/19 10:25 AM  Result Value Ref Range   Unit Number [id number]    Blood Component Type THAWED PLASMA    Unit division 00    Status of Unit ALLOCATED    Transfusion Status OK TO TRANSFUSE    Unit Number [id number]    Blood Component Type THAWED PLASMA    Unit division 00    Status of Unit ALLOCATED    Transfusion Status OK TO TRANSFUSE   Prepare cryoprecipitate     Status: None (Preliminary result)   Collection Time: 06/02/19 10:25 AM  Result Value Ref Range   Unit Number [id number]    Blood Component Type CRYPOOL THAW    Unit division 00    Status of Unit ISSUED    Transfusion Status      OK TO TRANSFUSE Performed at [facility], [place] [address], [place], [place] [address]   Glucose, capillary     Status: Abnormal   Collection Time: 06/02/19 11:03 AM  Result Value Ref Range   Glucose-Capillary 151 (H) 70 - 99 mg/dL  Urinalysis, Routine w reflex microscopic     Status: Abnormal   Collection Time: 06/02/19 12:24 PM  Result Value Ref Range   Color, Urine AMBER (A) YELLOW   APPearance CLOUDY (A) CLEAR   Specific Gravity, Urine 1.023 1.005 - 1.030   pH 5.0 5.0 - 8.0   Glucose, UA NEGATIVE NEGATIVE mg/dL   Hgb urine dipstick LARGE (A) NEGATIVE   Bilirubin Urine SMALL (A) NEGATIVE   Ketones, ur NEGATIVE NEGATIVE mg/dL   Protein, ur 100 (A) NEGATIVE mg/dL   Nitrite NEGATIVE NEGATIVE   Leukocytes,Ua TRACE (A) NEGATIVE   RBC /  HPF >50 (H) 0 - 5 RBC/hpf   WBC, UA 0-5 0 - 5 WBC/hpf   Bacteria, UA FEW (A) NONE SEEN   Squamous Epithelial / LPF 0-5 0 - 5   Hyaline  Casts, UA PRESENT    Amorphous Crystal PRESENT      No results found.  ROS:  As stated above in the HPI otherwise negative.  Blood pressure (!) 108/57, pulse 79, temperature (!) 96.8 F (36 C), resp. rate 17, height 5\' 5"  (1.651 m), weight 92 kg, SpO2 96 %.    PE: Gen: Confused, trying to get out of bed. HEENT:  [place]/AT, EOMI Neck: Supple, no LAD Lungs: CTA Bilaterally CV: RRR without M/G/R ABM: Soft, NTND, +BS Ext: No C/C/E  Assessment/Plan: 1) Probable esophageal variceal bleed. 2) Decompensated cirrhosis. 3) Multi-organ failure.   The patient's last INR was at 4.1.  It is not possible to perform an intervention with such a high INR.  Her blood pressure is being supported with pressors, but nursing reports that she is being weaned down.  Currently she is receiving another unit of FFP.  There are no reported issues with bleeding at this time.  Plan: 1) Correct INR. 2) EGD tomorrow. 3) Follow HGB and transfuse as necessary. 4) Continue with supportive care.  Chaitra Mast D 06/02/2019, 2:42 PM

## 2019-06-02 NOTE — Consult Note (Addendum)
[facility] Nephrology Consultation Note  Requesting MD: Dr Ruthann Cancer, Janett Billow Reason for consult: AKI  HPI:  Cindy Hubbard is a 65 y.o. female with history of hypertension, stroke, hepatitis C, alcohol abuse, admitted on 2/10 for rectal bleeding and hematemesis, seen as a consultation for the evaluation of acute kidney injury. Patient was brought in by EMS for rectal bleeding, diarrhea and hematemesis going on for about 3 weeks, per EMS the systolic blood pressure was 50s and bradycardic.  In the [facility] ER patient was hypothermic, bradycardic and severely hypotensive.  The lab consistent with sodium 127, potassium 4.8, BUN 50, creatinine 5.79, bilirubin 10.5, elevated ammonia level, 6.7, platelet 49 INR 5.8.  Patient was a started on IV Protonix, IV octreotide and IV fluid.  She was transferred to [facility], ICU for admission.  In ICU patient received multiple blood transfusion including PRBC, FFP and cryoprecipitate.  Today the labs showed creatinine level worsened to 5.90, potassium 5, sodium 128 and hemoglobin down to 6.8. The last serum creatinine level before this admission was 0.6 in 2016. She is currently confused.  Receiving IV octreotide, Levophed, blood product.  She is on safety precautions including soft restraint. She has Foley catheter and urine output is recorded only 20 cc.  The urine sodium was less than 10.  History limited from the patient.  She is alert awake and oriented to hospital however unable to provide detailed information.  GI was also consulted for decompensated liver failure.  Creatinine, Ser  Date/Time Value Ref Range Status  06/02/2019 02:57 AM 5.90 (H) 0.44 - 1.00 mg/dL Final  06/11/2019 07:18 PM 5.79 (H) 0.44 - 1.00 mg/dL Final  11/27/2014 09:41 PM 0.60 0.44 - 1.00 mg/dL Final     PMHx:   Past Medical History:  Diagnosis Date  . Hepatitis C   . Hypertension   . Stroke ([place])   . Thyroid disease    hyperthyroid    Past Surgical History:   Procedure Laterality Date  . APPENDECTOMY    . CHOLECYSTECTOMY    . HAND SURGERY      Family Hx: History reviewed. No pertinent family history.  Social History:  reports that she has been smoking. She has never used smokeless tobacco. She reports current alcohol use. She reports that she does not use drugs.  Allergies:  Allergies  Allergen Reactions  . Demerol [Meperidine] Anaphylaxis  . Morphine And Related Anaphylaxis  . Asa [Aspirin] Other (See Comments)    bleeding  . Biaxin [Clarithromycin] Other (See Comments)    unknown  . Nsaids Nausea And Vomiting    Medications: Prior to Admission medications   Medication Sig Start Date End Date Taking? Authorizing Provider  carvedilol (COREG) 25 MG tablet Take 25 mg by mouth 2 (two) times daily with a meal.   Yes [provider]  cloNIDine (CATAPRES) 0.2 MG tablet Take 0.2 mg by mouth 2 (two) times daily.   Yes [provider]  diazepam (VALIUM) 10 MG tablet Take 10 mg by mouth every 6 (six) hours as needed for anxiety.   Yes [provider]  levothyroxine (SYNTHROID) 175 MCG tablet Take 175 mcg by mouth daily before breakfast.    Yes [provider]  lisinopril (PRINIVIL,ZESTRIL) 40 MG tablet Take 40 mg by mouth daily.   Yes [provider]  methocarbamol (ROBAXIN) 500 MG tablet Take 500 mg by mouth every 6 (six) hours as needed for muscle spasms.   Yes [provider]  amoxicillin-clavulanate (AUGMENTIN)  875-125 MG tablet Take 1 tablet by mouth 2 (two) times daily. For ten days    [provider]  EPINEPHrine 0.3 mg/0.3 mL IJ SOAJ injection Inject 0.3 mg into the muscle daily as needed (allergic reaction).    [provider]  furosemide (LASIX) 80 MG tablet Take 40 mg by mouth 2 (two) times daily.    [provider]    I have reviewed the patient's current medications.  Labs:  Results for orders placed or performed during the hospital encounter of  05/28/2019 (from the past 48 hour(s))  Comprehensive metabolic panel     Status: Abnormal   Collection Time: 06/10/2019  7:18 PM  Result Value Ref Range   Sodium 127 (L) 135 - 145 mmol/L   Potassium 4.8 3.5 - 5.1 mmol/L   Chloride 88 (L) 98 - 111 mmol/L   CO2 20 (L) 22 - 32 mmol/L   Glucose, Bld 96 70 - 99 mg/dL   BUN 50 (H) 8 - 23 mg/dL   Creatinine, Ser 5.79 (H) 0.44 - 1.00 mg/dL   Calcium 7.7 (L) 8.9 - 10.3 mg/dL   Total Protein 5.9 (L) 6.5 - 8.1 g/dL   Albumin 2.1 (L) 3.5 - 5.0 g/dL   AST 136 (H) 15 - 41 U/L   ALT 99 (H) 0 - 44 U/L   Alkaline Phosphatase 80 38 - 126 U/L   Total Bilirubin 10.5 (H) 0.3 - 1.2 mg/dL   GFR calc non Af Amer 7 (L) >60 mL/min   GFR calc Af Amer 8 (L) >60 mL/min   Anion gap 19 (H) 5 - 15    Comment: Performed at [facility], [address], [place], [place] [address]  CBC     Status: Abnormal   Collection Time: 05/27/2019  7:18 PM  Result Value Ref Range   WBC 8.0 4.0 - 10.5 K/uL   RBC 1.64 (L) 3.87 - 5.11 MIL/uL   Hemoglobin 6.7 (LL) 12.0 - 15.0 g/dL    Comment: This critical result has verified and been called to [facility] by Finis Bud on 02 10 2021 at 2002, and has been read back.    HCT 17.9 (L) 36.0 - 46.0 %   MCV 109.1 (H) 80.0 - 100.0 fL   MCH 40.9 (H) 26.0 - 34.0 pg   MCHC 37.4 (H) 30.0 - 36.0 g/dL   RDW 21.1 (H) 11.5 - 15.5 %   Platelets 49 (L) 150 - 400 K/uL    Comment: Immature Platelet Fraction may be clinically indicated, consider ordering this additional test [id number]    nRBC 3.4 (H) 0.0 - 0.2 %    Comment: Performed at [facility], [address] [place], [place] [address]  Type and screen [facility]     Status: None (Preliminary result)   Collection Time: 06/10/2019  7:18 PM  Result Value Ref Range   ABO/RH(D) O POS    Antibody Screen NEG    Sample Expiration 06/04/2019,2359    Unit Number [id number]    Blood Component Type RED CELLS,LR    Unit division 00    Status of Unit ISSUED,FINAL    Transfusion Status OK TO  TRANSFUSE    Crossmatch Result      Compatible Performed at [facility], [address] [place], [place] [address]    Unit Number [id number]    Blood Component Type RED CELLS,LR    Unit division 00    Status of Unit ALLOCATED    Transfusion  Status OK TO TRANSFUSE    Crossmatch Result Compatible   ABO/Rh     Status: None   Collection Time: 06/06/2019  7:18 PM  Result Value Ref Range   ABO/RH(D)      O POS Performed at [facility], [address], [place], [place] [address]   Prepare Pheresed Platelets     Status: None   Collection Time: 05/31/2019  7:18 PM  Result Value Ref Range   Unit Number [id number]    Blood Component Type PLTPHER LR1    Unit division 00    Status of Unit ISSUED,FINAL    Transfusion Status OK TO TRANSFUSE   Protime-INR     Status: Abnormal   Collection Time: 06/16/2019  7:18 PM  Result Value Ref Range   Prothrombin Time 52.3 (H) 11.4 - 15.2 seconds   INR 5.8 (HH) 0.8 - 1.2    Comment: CRITICAL RESULT CALLED TO, READ BACK BY AND VERIFIED WITH: SAPPLET,J AT 2220 ON 2.10.21 BY ISLEY,B (NOTE) INR goal varies based on device and disease states. Performed at [facility], [address], [place], [place] [address]   HIV Antibody (routine testing w rflx)     Status: None   Collection Time: 05/25/2019  7:18 PM  Result Value Ref Range   HIV Screen 4th Generation wRfx NON REACTIVE NON REACTIVE    Comment: Performed at [place] [address] [place], [place] [address]  POC occult blood, ED     Status: Abnormal   Collection Time: 05/30/2019  7:34 PM  Result Value Ref Range   Fecal Occult Bld POSITIVE (A) NEGATIVE  Respiratory Panel by RT PCR (Flu A&B, Covid) - Nasopharyngeal Swab     Status: None   Collection Time: 05/26/2019  7:40 PM   Specimen: Nasopharyngeal Swab  Result Value Ref Range   SARS Coronavirus 2 by RT PCR NEGATIVE NEGATIVE    Comment: (NOTE) SARS-CoV-2 target nucleic acids are NOT DETECTED. The SARS-CoV-2 RNA is generally detectable  in upper respiratoy specimens during the acute phase of infection. The lowest concentration of SARS-CoV-2 viral copies this assay can detect is 131 copies/mL. A negative result does not preclude SARS-Cov-2 infection and should not be used as the sole basis for treatment or other patient management decisions. A negative result may occur with  improper specimen collection/handling, submission of specimen other than nasopharyngeal swab, presence of viral mutation(s) within the areas targeted by this assay, and inadequate number of viral copies (<131 copies/mL). A negative result must be combined with clinical observations, patient history, and epidemiological information. The expected result is Negative. Fact Sheet for Patients:  PinkCheek.be Fact Sheet for Healthcare Providers:  GravelBags.it This test is not yet ap proved or cleared by the [place] FDA and  has been authorized for detection and/or diagnosis of SARS-CoV-2 by FDA under an Emergency Use Authorization (EUA). This EUA will remain  in effect (meaning this test can be used) for the duration of the COVID-19 declaration under Section 564(b)(1) of the Act, 21 U.S.C. section 360bbb-3(b)(1), unless the authorization is terminated or revoked sooner.    Influenza A by PCR NEGATIVE NEGATIVE   Influenza B by PCR NEGATIVE NEGATIVE    Comment: (NOTE) The Xpert Xpress SARS-CoV-2/FLU/RSV assay is intended as an aid in  the diagnosis of influenza from Nasopharyngeal swab specimens and  should not be used as a sole basis for treatment. Nasal washings and  aspirates are unacceptable for Xpert Xpress SARS-CoV-2/FLU/RSV  testing. Fact  Sheet for Patients: PinkCheek.be Fact Sheet for Healthcare Providers: GravelBags.it This test is not yet approved or cleared by the [place] FDA and  has been authorized for detection  and/or diagnosis of SARS-CoV-2 by  FDA under an Emergency Use Authorization (EUA). This EUA will remain  in effect (meaning this test can be used) for the duration of the  Covid-19 declaration under Section 564(b)(1) of the Act, 21  U.S.C. section 360bbb-3(b)(1), unless the authorization is  terminated or revoked. Performed at [facility], [address] [place], [place] [address]   Prepare RBC     Status: None   Collection Time: 05/27/2019  8:30 PM  Result Value Ref Range   Order Confirmation      ORDER PROCESSED BY BLOOD BANK Performed at [facility], [address], [place], [place] [address]   Urine rapid drug screen (hosp performed)     Status: Abnormal   Collection Time: 06/06/2019  9:19 PM  Result Value Ref Range   Opiates NONE DETECTED NONE DETECTED   Cocaine NONE DETECTED NONE DETECTED   Benzodiazepines POSITIVE (A) NONE DETECTED   Amphetamines NONE DETECTED NONE DETECTED   Tetrahydrocannabinol NONE DETECTED NONE DETECTED   Barbiturates NONE DETECTED NONE DETECTED    Comment: (NOTE) DRUG SCREEN FOR MEDICAL PURPOSES ONLY.  IF CONFIRMATION IS NEEDED FOR ANY PURPOSE, NOTIFY LAB WITHIN 5 DAYS. LOWEST DETECTABLE LIMITS FOR URINE DRUG SCREEN Drug Class                     Cutoff (ng/mL) Amphetamine and metabolites    1000 Barbiturate and metabolites    200 Benzodiazepine                 [id number] Tricyclics and metabolites     300 Opiates and metabolites        300 Cocaine and metabolites        300 THC                            50 Performed at [facility], [address], [place], [place] [address]   Ammonia     Status: Abnormal   Collection Time: 05/29/2019  9:39 PM  Result Value Ref Range   Ammonia 130 (H) 9 - 35 umol/L    Comment: Performed at [facility], [address], [place], [place] [address]  Ethanol     Status: None   Collection Time: 06/10/2019  9:39 PM  Result Value Ref Range   Alcohol, Ethyl (B) <10 <10 mg/dL    Comment: (NOTE) Lowest detectable limit for  serum alcohol is 10 mg/dL. For medical purposes only. Performed at [facility], [address], [place], [place] [address]   MRSA PCR Screening     Status: None   Collection Time: 06/02/19  2:38 AM   Specimen: Nasopharyngeal  Result Value Ref Range   MRSA by PCR NEGATIVE NEGATIVE    Comment:        The GeneXpert MRSA Assay (FDA approved for NASAL specimens only), is one component of a comprehensive MRSA colonization surveillance program. It is not intended to diagnose MRSA infection nor to guide or monitor treatment for MRSA infections. Performed at [facility], [place] [address] [place], [place] [address]   Prepare fresh frozen plasma     Status: None (Preliminary result)   Collection Time: 06/02/19  2:54 AM  Result Value Ref Range   Unit  Number [id number]    Blood Component Type THW PLS APHR    Unit division A0    Status of Unit ISSUED    Transfusion Status      OK TO TRANSFUSE Performed at [facility], [place] [address], [place], [place] [address]    Unit Number [id number]    Blood Component Type THAWED PLASMA    Unit division 00    Status of Unit ISSUED    Transfusion Status OK TO TRANSFUSE   Type and screen [place]     Status: None (Preliminary result)   Collection Time: 06/02/19  2:57 AM  Result Value Ref Range   ABO/RH(D) O POS    Antibody Screen NEG    Sample Expiration 06/05/2019,2359    Unit Number [id number]    Blood Component Type RED CELLS,LR    Unit division 00    Status of Unit ISSUED    Transfusion Status OK TO TRANSFUSE    Crossmatch Result      Compatible Performed at [facility], [place] [address] [place], [place] [address]    Unit Number [id number]    Blood Component Type RED CELLS,LR    Unit division 00    Status of Unit ALLOCATED    Transfusion Status OK TO TRANSFUSE    Crossmatch Result Compatible   Comprehensive metabolic panel     Status: Abnormal   Collection Time: 06/02/19  2:57 AM   Result Value Ref Range   Sodium 128 (L) 135 - 145 mmol/L   Potassium 5.0 3.5 - 5.1 mmol/L   Chloride 93 (L) 98 - 111 mmol/L   CO2 18 (L) 22 - 32 mmol/L   Glucose, Bld 116 (H) 70 - 99 mg/dL   BUN 46 (H) 8 - 23 mg/dL   Creatinine, Ser 5.90 (H) 0.44 - 1.00 mg/dL   Calcium 7.4 (L) 8.9 - 10.3 mg/dL   Total Protein 5.9 (L) 6.5 - 8.1 g/dL   Albumin 2.1 (L) 3.5 - 5.0 g/dL   AST 131 (H) 15 - 41 U/L   ALT 100 (H) 0 - 44 U/L   Alkaline Phosphatase 84 38 - 126 U/L   Total Bilirubin 10.6 (H) 0.3 - 1.2 mg/dL   GFR calc non Af Amer 7 (L) >60 mL/min   GFR calc Af Amer 8 (L) >60 mL/min   Anion gap 17 (H) 5 - 15    Comment: Performed at [facility], [place] [address], [place], [place] [address]  Magnesium     Status: Abnormal   Collection Time: 06/02/19  2:57 AM  Result Value Ref Range   Magnesium 1.0 (L) 1.7 - 2.4 mg/dL    Comment: Performed at [place] [address] [place], [place] [address]  CBC     Status: Abnormal   Collection Time: 06/02/19  2:57 AM  Result Value Ref Range   WBC 6.9 4.0 - 10.5 K/uL    Comment: REPEATED TO VERIFY WHITE COUNT CONFIRMED ON SMEAR    RBC 1.94 (L) 3.87 - 5.11 MIL/uL   Hemoglobin 7.5 (L) 12.0 - 15.0 g/dL   HCT 20.5 (L) 36.0 - 46.0 %   MCV 105.7 (H) 80.0 - 100.0 fL   MCH 38.7 (H) 26.0 - 34.0 pg   MCHC 36.6 (H) 30.0 - 36.0 g/dL   RDW 19.9 (H) 11.5 - 15.5 %   Platelets 79 (L) 150 - 400 K/uL    Comment: REPEATED TO  VERIFY PLATELET COUNT CONFIRMED BY SMEAR Immature Platelet Fraction may be clinically indicated, consider ordering this additional test [id number]    nRBC 5.5 (H) 0.0 - 0.2 %    Comment: Performed at [facility], [address] [place], [place] [address]  Protime-INR     Status: Abnormal   Collection Time: 06/02/19  2:57 AM  Result Value Ref Range   Prothrombin Time 40.0 (H) 11.4 - 15.2 seconds   INR 4.1 (HH) 0.8 - 1.2    Comment: REPEATED TO VERIFY CRITICAL RESULT CALLED TO, READ BACK BY AND VERIFIED WITH: M.  Steffanie Dunn 0406 06/02/2019 T. TYSOR (NOTE) INR goal varies based on device and disease states. Performed at [facility], [place] [address], [place], [place] [address]   APTT     Status: Abnormal   Collection Time: 06/02/19  2:57 AM  Result Value Ref Range   aPTT 56 (H) 24 - 36 seconds    Comment:        IF BASELINE aPTT IS ELEVATED, SUGGEST PATIENT RISK ASSESSMENT BE USED TO DETERMINE APPROPRIATE ANTICOAGULANT THERAPY. Performed at [facility], [place] [address], [place], [place] [address]   Fibrinogen     Status: Abnormal   Collection Time: 06/02/19  2:57 AM  Result Value Ref Range   Fibrinogen 75 (LL) 210 - 475 mg/dL    Comment: REPEATED TO VERIFY CRITICAL RESULT CALLED TO, READ BACK BY AND VERIFIED WITH: Renette Butters 0406 06/02/2019 Mena Goes Performed at [facility], [place] [address] [place], [place] [address]   ABO/Rh     Status: None   Collection Time: 06/02/19  2:57 AM  Result Value Ref Range   ABO/RH(D)      O POS Performed at [place] [address] [place], [place] [address]   Glucose, capillary     Status: Abnormal   Collection Time: 06/02/19  3:28 AM  Result Value Ref Range   Glucose-Capillary 111 (H) 70 - 99 mg/dL  Prepare cryoprecipitate     Status: None (Preliminary result)   Collection Time: 06/02/19  4:13 AM  Result Value Ref Range   Unit Number [id number]    Blood Component Type CRYPOOL THAW    Unit division 00    Status of Unit ISSUED    Transfusion Status      OK TO TRANSFUSE Performed at [facility], [place] [address], [place], [place] [address]   Prepare Pheresed Platelets     Status: None (Preliminary result)   Collection Time: 06/02/19  4:48 AM  Result Value Ref Range   Unit Number [id number]    Blood Component Type PLTP LR2 PAS    Unit division 00    Status of Unit ISSUED    Transfusion Status      OK TO TRANSFUSE Performed at [place] [address], [place],  [address]   Ammonia      Status: Abnormal   Collection Time: 06/02/19  6:11 AM  Result Value Ref Range   Ammonia 143 (H) 9 - 35 umol/L    Comment: Performed at [facility], [place] [address], [place],  [address]  Creatinine, urine, random     Status: None   Collection Time: 06/02/19  6:24 AM  Result Value Ref Range   Creatinine, Urine 457.30 mg/dL    Comment: RESULTS CONFIRMED BY MANUAL DILUTION Performed at [place],  [place] [address]   Sodium, urine, random     Status: None   Collection Time: 06/02/19  6:24 AM  Result Value Ref Range   Sodium, Ur <10 mmol/L    Comment: Performed at [place] [address] [place], [place] [address]  Glucose, capillary     Status: Abnormal   Collection Time: 06/02/19  8:21 AM  Result Value Ref Range   Glucose-Capillary 140 (H) 70 - 99 mg/dL  CBC     Status: Abnormal   Collection Time: 06/02/19  8:51 AM  Result Value Ref Range   WBC 6.2 4.0 - 10.5 K/uL   RBC 1.76 (L) 3.87 - 5.11 MIL/uL   Hemoglobin 6.8 (LL) 12.0 - 15.0 g/dL    Comment: REPEATED TO VERIFY THIS CRITICAL RESULT HAS VERIFIED AND BEEN CALLED TO P.SHELTON,RN BY BONNIE DAVIS ON 02 11 2021 AT 0941, AND HAS BEEN READ BACK.     HCT 18.4 (L) 36.0 - 46.0 %   MCV 104.5 (H) 80.0 - 100.0 fL   MCH 38.6 (H) 26.0 - 34.0 pg   MCHC 37.0 (H) 30.0 - 36.0 g/dL   RDW 20.1 (H) 11.5 - 15.5 %   Platelets 88 (L) 150 - 400 K/uL    Comment: REPEATED TO VERIFY Immature Platelet Fraction may be clinically indicated, consider ordering this additional test [id number] CONSISTENT WITH PREVIOUS RESULT    nRBC 5.7 (H) 0.0 - 0.2 %    Comment: Performed at [facility], [place] [address], [place], [place] [address]  Vitamin B12     Status: Abnormal   Collection Time: 06/02/19  8:51 AM  Result Value Ref Range   Vitamin B-12 2,000 (H) 180 - 914 pg/mL    Comment: (NOTE) This assay is not validated for testing neonatal or myeloproliferative syndrome specimens for Vitamin B12  levels. Performed at [facility], [place] [address], [place], [place] [address]   Prepare RBC     Status: None   Collection Time: 06/02/19 10:23 AM  Result Value Ref Range   Order Confirmation      ORDER PROCESSED BY BLOOD BANK Performed at [facility], [place] [address] [place], [place] [address]   Prepare fresh frozen plasma     Status: None (Preliminary result)   Collection Time: 06/02/19 10:25 AM  Result Value Ref Range   Unit Number [id number]    Blood Component Type THAWED PLASMA    Unit division 00    Status of Unit ALLOCATED    Transfusion Status OK TO TRANSFUSE    Unit Number [id number]    Blood Component Type THAWED PLASMA    Unit division 00    Status of Unit ALLOCATED    Transfusion Status OK TO TRANSFUSE   Prepare cryoprecipitate     Status: None (Preliminary result)   Collection Time: 06/02/19 10:25 AM  Result Value Ref Range   Unit Number [id number]    Blood Component Type CRYPOOL THAW    Unit division 00    Status of Unit ALLOCATED    Transfusion Status OK TO TRANSFUSE   Glucose, capillary     Status: Abnormal   Collection Time: 06/02/19 11:03 AM  Result Value Ref Range   Glucose-Capillary 151 (H) 70 - 99 mg/dL     ROS: Unable to obtain review of system as patient has encephalopathy.  Physical Exam: Vitals:   06/02/19 1116 06/02/19 1130  BP: 102/61 101/61  Pulse: 82 80  Resp:  19 19  Temp: (!) 97 F (36.1 C) (!) 97 F (36.1 C)  SpO2: 96% 95%     General exam: Confused female lying on bed with soft restraint, not in distress, icteric Respiratory system: Clear to auscultation. Respiratory effort normal. No wheezing or crackle Cardiovascular system: S1 & S2 heard, RRR.  Trace pedal edema. Gastrointestinal system: Abdomen is nondistended, soft and nontender. Normal bowel sounds heard. Central nervous system: Alert, awake and oriented to hospital. No focal neurological deficits. 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  She also has severe anemia and shock. Urine sodium <10, acute liver failure, hypotension.  Agree with octreotide, Levophed and blood transfusion. No urine output.  I will order IV albumin and a dose of Lasix. Order urinalysis, kidney ultrasound Given, acute liver failure and multiple comorbidities, I do not think she is a candidate for dialysis.  However, we may try trial of CRRT for 24 to 48 hours if she remains anuric.  I tried to call patient's husband without success.  GI consult is pending.  I recommend palliative care consult to discuss goals of care.  #Hemorrhagic shock due to GI bleed: Patient has coagulopathy.  Received multiple blood products.  Currently on Levophed, PPI.  Blood pressure is improved.  Continue to monitor. Blood transfusion per PCCM team.  #Acute alcoholic hepatitis/coagulopathy/ascites/liver cirrhosis: Seems poor prognosis. On steroid, GI evaluation.     #Hyponatremia due to liver failure, hypervolemic: We will try a dose of Lasix.  #Acute encephalopathy, hepatic: Lactulose.    #Metabolic acidosis: Given liver cirrhosis/hepatic encephalopathy, we will not correct it.  Thank you for the consult, will follow with you.   Precilla Purnell Tanna Furry 06/02/2019, 12:23 PM  [facility].

## 2019-06-03 LAB — TYPE AND SCREEN
ABO/RH(D): O POS
ABO/RH(D): O POS
Antibody Screen: NEGATIVE
Antibody Screen: NEGATIVE
Unit division: 0
Unit division: 0
Unit division: 0
Unit division: 0

## 2019-06-03 LAB — PREPARE FRESH FROZEN PLASMA
Unit division: 0
Unit division: 0
Unit division: 0
Unit division: 0

## 2019-06-03 LAB — BPAM FFP
Blood Product Expiration Date: 202102162359
Blood Product Expiration Date: 202102162359
Blood Product Expiration Date: 202102162359
Blood Product Expiration Date: 202102162359
Blood Product Expiration Date: 202102172359
Blood Product Expiration Date: 202102172359
Blood Product Expiration Date: 202102172359
ISSUE DATE / TIME: 202102110500
ISSUE DATE / TIME: 202102110500
ISSUE DATE / TIME: 202102111542
ISSUE DATE / TIME: 202102111717
Unit Type and Rh: 5100
Unit Type and Rh: 5100
Unit Type and Rh: 5100
Unit Type and Rh: 5100
Unit Type and Rh: 5100
Unit Type and Rh: 5100
Unit Type and Rh: 5100

## 2019-06-03 LAB — BPAM RBC
Blood Product Expiration Date: 202103032359
Blood Product Expiration Date: 202103102359
Blood Product Expiration Date: 202103112359
Blood Product Expiration Date: 202103112359
ISSUE DATE / TIME: 202102102103
ISSUE DATE / TIME: 202102111053
ISSUE DATE / TIME: 202102111250
Unit Type and Rh: 5100
Unit Type and Rh: 9500
Unit Type and Rh: 5100
Unit Type and Rh: 5100

## 2019-06-03 LAB — PREPARE PLATELET PHERESIS: Unit division: 0

## 2019-06-03 LAB — BPAM CRYOPRECIPITATE
Blood Product Expiration Date: 202102111026
Blood Product Expiration Date: 202102111630
ISSUE DATE / TIME: 202102110458
ISSUE DATE / TIME: 202102111436
Unit Type and Rh: 5100
Unit Type and Rh: 5100

## 2019-06-03 LAB — COMPREHENSIVE METABOLIC PANEL
ALT: 99 U/L — ABNORMAL HIGH (ref 0–44)
AST: 135 U/L — ABNORMAL HIGH (ref 15–41)
Albumin: 3.1 g/dL — ABNORMAL LOW (ref 3.5–5.0)
Alkaline Phosphatase: 78 U/L (ref 38–126)
Anion gap: 17 — ABNORMAL HIGH (ref 5–15)
BUN: 48 mg/dL — ABNORMAL HIGH (ref 8–23)
CO2: 23 mmol/L (ref 22–32)
Calcium: 7.8 mg/dL — ABNORMAL LOW (ref 8.9–10.3)
Chloride: 92 mmol/L — ABNORMAL LOW (ref 98–111)
Creatinine, Ser: 5.81 mg/dL — ABNORMAL HIGH (ref 0.44–1.00)
GFR calc Af Amer: 8 mL/min — ABNORMAL LOW (ref >60)
GFR calc non Af Amer: 7 mL/min — ABNORMAL LOW (ref >60)
Glucose, Bld: 243 mg/dL — ABNORMAL HIGH (ref 70–99)
Potassium: 4.6 mmol/L (ref 3.5–5.1)
Sodium: 132 mmol/L — ABNORMAL LOW (ref 135–145)
Total Bilirubin: 11.3 mg/dL — ABNORMAL HIGH (ref 0.3–1.2)
Total Protein: 6.7 g/dL (ref 6.5–8.1)

## 2019-06-03 LAB — BPAM PLATELET PHERESIS
Blood Product Expiration Date: 202102122359
ISSUE DATE / TIME: 202102110458
Unit Type and Rh: 6200

## 2019-06-03 LAB — FIBRINOGEN: Fibrinogen: 156 mg/dL — ABNORMAL LOW (ref 210–475)

## 2019-06-03 LAB — PREPARE CRYOPRECIPITATE
Unit division: 0
Unit division: 0

## 2019-06-03 LAB — GLUCOSE, CAPILLARY
Glucose-Capillary: 225 mg/dL — ABNORMAL HIGH (ref 70–99)
Glucose-Capillary: 204 mg/dL — ABNORMAL HIGH (ref 70–99)

## 2019-06-03 LAB — PROTIME-INR
INR: 3 — ABNORMAL HIGH (ref 0.8–1.2)
Prothrombin Time: 31.1 seconds — ABNORMAL HIGH (ref 11.4–15.2)

## 2019-06-03 LAB — MAGNESIUM: Magnesium: 1.5 mg/dL — ABNORMAL LOW (ref 1.7–2.4)

## 2019-06-03 SURGERY — ESOPHAGOGASTRODUODENOSCOPY (EGD) WITH PROPOFOL
Anesthesia: Moderate Sedation

## 2019-06-03 MED ORDER — MIDAZOLAM 50MG/50ML (1MG/ML) PREMIX INFUSION
0.5000 mg/h | INTRAVENOUS | Status: DC
  Administered 2019-06-03: 12:00:00 2 mg/h via INTRAVENOUS
  Filled 2019-06-03 (×2): qty 50

## 2019-06-03 MED ORDER — MIDAZOLAM HCL 2 MG/2ML IJ SOLN
0.5000 mg | INTRAMUSCULAR | Status: DC | PRN
  Filled 2019-06-03: qty 2

## 2019-06-03 MED ORDER — MIDAZOLAM HCL 2 MG/2ML IJ SOLN
INTRAMUSCULAR | Status: AC
  Administered 2019-06-03: 0.5 mg via INTRAVENOUS
  Filled 2019-06-03: qty 2

## 2019-06-03 MED ORDER — SODIUM CHLORIDE 0.9% IV SOLUTION: Freq: Once | INTRAVENOUS | Status: DC

## 2019-06-03 MED ORDER — DEXMEDETOMIDINE HCL IN NACL 400 MCG/100ML IV SOLN: 0.2000 ug/kg/h | INTRAVENOUS | Status: DC

## 2019-06-03 MED ORDER — FENTANYL 2500MCG IN NS 250ML (10MCG/ML) PREMIX INFUSION
0.0000 ug/h | INTRAVENOUS | Status: DC
  Administered 2019-06-03 (×2): 400 ug/h via INTRAVENOUS
  Filled 2019-06-03 (×2): qty 250

## 2019-06-04 LAB — CALCIUM, IONIZED: Calcium, Ionized, Serum: 3.7 mg/dL — ABNORMAL LOW (ref 4.5–5.6)

## 2019-06-20 NOTE — Progress Notes (Signed)
eLink Physician-Brief Progress Note Patient Name: Cindy Hubbard DOB: April 02, 1956 MRN: [id number]   Date of Service  06-25-19  HPI/Events of Note  Agitation - QTc prolonged, therefore, can't give Precedex or Haldol. Allergy to Meperidine (anaphylaxis), therefore, can't give Fentanyl.   eICU Interventions  Will order: 1. Versed 0.5 mg IV Q 4 hours PRN agitation.         [name] Eugene 06/25/2019, 3:20 AM

## 2019-06-20 NOTE — Progress Notes (Signed)
Pt with labored respirations, bicarb stopped.   Lost iv access overnight holding some infusions. Was able to be weaned off levo with transfusions but at this time BP dropping with map in 60's.   Nephrology has appropriately stated she is not a dialysis candidate.  Will hold of futher blood product.  D/w husband will need to transition to comfort measures at this time.  He will reach out to children and come in as soon as possible He was advised of the visitation policy due to the current state of emergency we are in.   RN updated Pt remains DNR.

## 2019-06-20 NOTE — Progress Notes (Signed)
Pt appearing comfortable and w/o distress. Family at bs as of 24 AM: emotional support given and questions encouraged and answered. Continue emotional support.

## 2019-06-20 NOTE — Progress Notes (Signed)
eLink Physician-Brief Progress Note Patient Name: Cindy Hubbard DOB: November 03, 1955 MRN: [id number]   Date of Service  07-03-2019  HPI/Events of Note  Agitation - Versed not effective. QTc = 0.484 seconds.   eICU Interventions  Will order: 1. Low dose Precedex IV infusion (0.2 mcg/kg/hour to 0.5 mcg/kg/hour) . Titrate to RASS = 0.  2. Monitor Qtc interval Q 4 hours. Notify MD if QTc interval > 500 milliseconds.      Intervention Category Major Interventions: Delirium, psychosis, severe agitation - evaluation and management  Timur Nibert Eugene July 03, 2019, 4:30 AM

## 2019-06-20 NOTE — Progress Notes (Signed)
Family at bs when patient resolved to agonal heartbeat and agonal breathing. Patient death pronounced at 70, confirmed lack of heartbeat and respirations with IT trainer. Emotional support given.

## 2019-06-20 NOTE — Progress Notes (Signed)
Patient ID: Cindy Hubbard, female   DOB: 1955-08-17, 64 y.o.   MRN: [id number] [facility] Progress Note   Assessment/ Plan:   1. Acute kidney Injury: Data available consistent with type I hepatorenal syndrome likely precipitated by recent GI bleed and complicated by hemorrhagic shock.  Anuric overnight and net fluid positive by 4.5 L.  Increasing oxygenation requirements-we will discontinue sodium bicarbonate drip at this time to limit volume loading.  Unfortunately with current constellation of problems, she is not a candidate for dialysis (especially effectively drinking and unable to risk for OLTX).  I support transition to comfort care/palliative care. 2.  Acute blood loss anemia secondary to GI bleed: Improved status post blood transfusion, plans per GI for EGD after attempt at correcting INR. 3.  Hyponatremia: Secondary to acute kidney injury and decompensated cirrhosis. 4.  Acute encephalopathy: Possibly multifactorial but primarily hepatic.  Lactulose examination, service.  Subjective:   Problems with agitation noted overnight difficulty with sedation.   Objective:   BP (!) 88/71   Pulse 71   Temp (!) 96.3 F (35.7 C)   Resp 13   Ht 5\' 5"  (1.651 m)   Wt 116.4 kg   SpO2 95%   BMI 42.70 kg/m   Intake/Output Summary (Last 24 hours) at 11-Jun-2019 [id number] Last data filed at 2019-06-11 0500 Gross per 24 hour  Intake 5828.57 ml  Output 1235 ml  Net 4593.57 ml   Weight change: 25.7 kg  Physical Exam: Gen: Appears confused, agitated and flailing around in bed CVS: Pulse regular rhythm, normal rate, S1 with ejection systolic murmur Resp: Coarse breath sounds bilaterally, no distinct rales Abd: Soft, obese, nontender Ext: Trace lower extremity edema  Imaging: US RENAL  Result Date: 06/02/2019 CLINICAL DATA:  Acute renal insufficiency, hepatitis C, hypertension EXAM: RENAL / URINARY TRACT ULTRASOUND COMPLETE COMPARISON:  None. FINDINGS: Right Kidney: Renal measurements:  11.2 x 6.2 x 7.2 cm = volume: 263 mL . Echogenicity within normal limits. No mass or hydronephrosis visualized. Left Kidney: Renal measurements: 12.4 x 6.0 x 5.6 cm = volume: 220 mL. Echogenicity within normal limits. No mass or hydronephrosis visualized. Bladder: The bladder is totally decompressed which limits evaluation. Other: Incidental ascites is seen within the upper abdomen. Nodular contour of the liver suggests cirrhosis. IMPRESSION: 1. Unremarkable appearance of the kidneys. 2. Evidence of cirrhosis, with trace ascites in the upper abdomen. Electronically Signed   By: Randa Ngo M.D.   On: 06/02/2019 15:21    Labs: BMET Recent Labs  Lab 06/08/2019 1918 06/02/19 0257 06/02/19 2112 2019/06/11 0247  NA 127* 128* 130* 132*  K 4.8 5.0 4.7 4.6  CL 88* 93* 90* 92*  CO2 20* 18* 22 23  GLUCOSE 96 116* 239* 243*  BUN 50* 46* 48* 48*  CREATININE 5.79* 5.90* 5.64* 5.81*  CALCIUM 7.7* 7.4* 8.0* 7.8*   CBC Recent Labs  Lab 05/31/2019 1918 06/02/19 0257 06/02/19 0851 06/02/19 2112  WBC 8.0 6.9 6.2 6.3  NEUTROABS  --   --   --  5.0  HGB 6.7* 7.5* 6.8* 8.0*  HCT 17.9* 20.5* 18.4* 21.7*  MCV 109.1* 105.7* 104.5* 96.9  PLT 49* 79* 88* 74*   Medications:    . sodium chloride   Intravenous Once  . sodium chloride   Intravenous Once  . sodium chloride   Intravenous Once  . sodium chloride   Intravenous Once  . Chlorhexidine Gluconate Cloth  6 each Topical Daily  . feeding supplement (ENSURE ENLIVE)  237  mL Oral BID BM  . folic acid  1 mg Intravenous Daily  . lactulose  300 mL Rectal BID  . methylPREDNISolone (SOLU-MEDROL) injection  40 mg Intravenous Daily  . multivitamin with minerals  1 tablet Oral Daily  . [START ON 06/05/2019] pantoprazole  40 mg Intravenous Q12H  . rifaximin  550 mg Oral BID  . thiamine  100 mg Oral Daily   Or  . thiamine  100 mg Intravenous Daily   Elmarie Shiley, MD 06-22-2019, 6:57 AM

## 2019-06-20 NOTE — Progress Notes (Signed)
Spoke with Dr. Gilford Raid. Patient is on protonix, levo, bicarb, and octreotide with 3 PIVs. Patient receiving multiple blood products which means some continuous drips have to be stopped in order to run blood. MD does not wish to place a central line at this time due to INR. MD also wants more clarification on DNR status.

## 2019-06-20 NOTE — Progress Notes (Signed)
Notified Kathlee Nations at [facility] that patient's O2 requirements have been increasing throughout the night. Patient was RA at the beginning of the shift-- now she is on 14L on the venturi mask. Patient is still +2 Rass. Kathlee Nations to pass this on to MD.

## 2019-06-20 NOTE — Death Summary Note (Signed)
DEATH SUMMARY   Patient Details  Name: Cindy Hubbard MRN: [id number] DOB: 12-Aug-1955  Admission/Discharge Information   Admit Date:  06-21-19  Date of Death: Date of Death: 2019/06/23  Time of Death: Time of Death: 1928(asystole at 62 but last aggonal breath taken at 1925)  Length of Stay: 2  Referring Physician: Curly Rim, MD   Reason(s) for Hospitalization  shock and encephalopathy  Diagnoses  Preliminary cause of death: Sepsis with multi-organ dysfunction ([place]) Secondary Diagnoses (including complications and co-morbidities):  Principal Problem:   Acute GI bleeding Active Problems:   Hypothermia   Hyponatremia   Hypotension   Hyperammonemia (HCC)   Thrombocytopenia (HCC)   AKI (acute kidney injury) ([place])   Hypoalbuminemia   Chronic hepatitis C without hepatic coma (HCC)   Hypothyroidism   Essential hypertension   Transaminitis   Nonspecific abnormal toxicology   Prolonged QT interval   GIB (gastrointestinal bleeding)   Hepatic encephalopathy (HCC)   High anion gap metabolic acidosis   Hepatorenal syndrome ([place])   Coagulopathy ([place])   Acute liver failure with hepatic coma [facility])   Brief Hospital Course (including significant findings, care, treatment, and services provided and events leading to death)  Cindy Hubbard is a 64 y.o. year old female who has a PMH Hep C, ETOH abuse,hypothyroidism, prior CVA who presented to [facility] 20-Jun-2022 with Coffee ground emesis. Coffee ground emesis reportedly began 3 weeks prior to presentation, with concomitant dark tarry stools. In ED, patient noted to be hypotensive, hypothermic, with acute anemia Hgb 6.7, plt 49, TBili 10.5,m mild transaminitis, Ammonia 130. Cr 5.79, BUN 50. Was given 1 PRBC and 1 plt at [facility].  ECG with prolonged qtc 502, Sinus bradycardia    Pt was admitted on 2/11 and expired the following day. She developed progressive shock 2/2 gib and acidosis with multisystem organ failure. She remained encephalopathic and  GI/Renal consulted. Due to her ongoing etoh use and progressive organ failure she was designated a poor candidate for dialysis and her instability made her endoscopy challenging. Family opted for comfort care at the reecommendation of consulting services and ccm.   Pt expired with family visiting peacefully.     Pertinent Labs and Studies  Significant Diagnostic Studies US RENAL  Result Date: 06/02/2019 CLINICAL DATA:  Acute renal insufficiency, hepatitis C, hypertension EXAM: RENAL / URINARY TRACT ULTRASOUND COMPLETE COMPARISON:  None. FINDINGS: Right Kidney: Renal measurements: 11.2 x 6.2 x 7.2 cm = volume: 263 mL . Echogenicity within normal limits. No mass or hydronephrosis visualized. Left Kidney: Renal measurements: 12.4 x 6.0 x 5.6 cm = volume: 220 mL. Echogenicity within normal limits. No mass or hydronephrosis visualized. Bladder: The bladder is totally decompressed which limits evaluation. Other: Incidental ascites is seen within the upper abdomen. Nodular contour of the liver suggests cirrhosis. IMPRESSION: 1. Unremarkable appearance of the kidneys. 2. Evidence of cirrhosis, with trace ascites in the upper abdomen. Electronically Signed   By: Randa Ngo M.D.   On: 06/02/2019 15:21    Microbiology Recent Results (from the past 240 hour(s))  Respiratory Panel by RT PCR (Flu A&B, Covid) - Nasopharyngeal Swab     Status: None   Collection Time: 06/21/19  7:40 PM   Specimen: Nasopharyngeal Swab  Result Value Ref Range Status   SARS Coronavirus 2 by RT PCR NEGATIVE NEGATIVE Final    Comment: (NOTE) SARS-CoV-2 target nucleic acids are NOT DETECTED. The SARS-CoV-2 RNA is generally detectable in upper respiratoy specimens during the acute phase of infection. The  lowest concentration of SARS-CoV-2 viral copies this assay can detect is 131 copies/mL. A negative result does not preclude SARS-Cov-2 infection and should not be used as the sole basis for treatment or other patient  management decisions. A negative result may occur with  improper specimen collection/handling, submission of specimen other than nasopharyngeal swab, presence of viral mutation(s) within the areas targeted by this assay, and inadequate number of viral copies (<131 copies/mL). A negative result must be combined with clinical observations, patient history, and epidemiological information. The expected result is Negative. Fact Sheet for Patients:  PinkCheek.be Fact Sheet for Healthcare Providers:  GravelBags.it This test is not yet ap proved or cleared by the [place] FDA and  has been authorized for detection and/or diagnosis of SARS-CoV-2 by FDA under an Emergency Use Authorization (EUA). This EUA will remain  in effect (meaning this test can be used) for the duration of the COVID-19 declaration under Section 564(b)(1) of the Act, 21 U.S.C. section 360bbb-3(b)(1), unless the authorization is terminated or revoked sooner.    Influenza A by PCR NEGATIVE NEGATIVE Final   Influenza B by PCR NEGATIVE NEGATIVE Final    Comment: (NOTE) The Xpert Xpress SARS-CoV-2/FLU/RSV assay is intended as an aid in  the diagnosis of influenza from Nasopharyngeal swab specimens and  should not be used as a sole basis for treatment. Nasal washings and  aspirates are unacceptable for Xpert Xpress SARS-CoV-2/FLU/RSV  testing. Fact Sheet for Patients: PinkCheek.be Fact Sheet for Healthcare Providers: GravelBags.it This test is not yet approved or cleared by the [place] FDA and  has been authorized for detection and/or diagnosis of SARS-CoV-2 by  FDA under an Emergency Use Authorization (EUA). This EUA will remain  in effect (meaning this test can be used) for the duration of the  Covid-19 declaration under Section 564(b)(1) of the Act, 21  U.S.C. section 360bbb-3(b)(1), unless the  authorization is  terminated or revoked. Performed at [facility], [address] [place], [place] [address]   MRSA PCR Screening     Status: None   Collection Time: 06/02/19  2:38 AM   Specimen: Nasopharyngeal  Result Value Ref Range Status   MRSA by PCR NEGATIVE NEGATIVE Final    Comment:        The GeneXpert MRSA Assay (FDA approved for NASAL specimens only), is one component of a comprehensive MRSA colonization surveillance program. It is not intended to diagnose MRSA infection nor to guide or monitor treatment for MRSA infections. Performed at [facility], [place] [address] [place], [place] [address]     Lab Basic Metabolic Panel: Recent Labs  Lab 06/02/19 2112 June 07, 2019 0247  NA 130* 132*  K 4.7 4.6  CL 90* 92*  CO2 22 23  GLUCOSE 239* 243*  BUN 48* 48*  CREATININE 5.64* 5.81*  CALCIUM 8.0* 7.8*  MG  --  1.5*   Liver Function Tests: Recent Labs  Lab 06/02/19 2112 06/07/2019 0247  AST 138* 135*  ALT 103* 99*  ALKPHOS 82 78  BILITOT 11.8* 11.3*  PROT 6.9 6.7  ALBUMIN 3.0* 3.1*   No results for input(s): LIPASE, AMYLASE in the last 168 hours. Recent Labs  Lab 06/02/19 2112  AMMONIA 133*   CBC: Recent Labs  Lab 06/02/19 2112  WBC 6.3  NEUTROABS 5.0  HGB 8.0*  HCT 21.7*  MCV 96.9  PLT 74*   Cardiac Enzymes: No results for input(s): CKTOTAL, CKMB, CKMBINDEX, TROPONINI in the last 168 hours. Sepsis Labs: Recent  Labs  Lab 06/02/19 2112  WBC 6.3    Procedures/Operations  See chart.   Audria Nine 06/09/2019, 8:54 AM

## 2019-06-20 NOTE — Progress Notes (Signed)
eLink Physician-Brief Progress Note Patient Name: Cindy Hubbard DOB: Aug 27, 1955 MRN: [id number]   Date of Service  06-24-19  HPI/Events of Note  Coagulopathy - INR = 3.0, Platelets = 74, Hgb = 8.0 and Fibrinogen = 156.   eICU Interventions  Will transfuse 3 units FFP now.      Intervention Category Major Interventions: Other:  [name] 2019-06-24, 6:38 AM

## 2019-06-20 NOTE — Progress Notes (Signed)
NAME:  Cindy Hubbard, MRN:  [id number], DOB:  16-Nov-1955, LOS: 2 ADMISSION DATE:  05/25/2019, CONSULTATION DATE:  06/02/19 CHIEF COMPLAINT:  Upper GI bleed   Brief History   Cindy Hubbard is a 64 yo F w/ PMHx of BRCA2 +, HTN, spinal stenosis, hypothyroidism, chronic Hep C and ETOH use w/ resultant Cirrhosis  who presented to [facility] for a 3 week h/o upper GI bleed. Her last alcohol consumption was 2 weeks prior to admission.  In the ED, she was hypotensive, hypothermic, and acutely encephopathic.  Creatinine was 5.79 and patient was oliguric.  Nephrology consulted and said that the patient is not a candidate for hemodialysis.  Husband was contacted 2/12 to discuss need to transition to comfort measures.  He will reach out to children and come in as soon as possible.  History of present illness   Coffee ground emesis reportedly began 3 weeks prior to presentation, with concomitant dark tarry stools.  She has history of alcohol abuse, but she stated that last alcohol consumption was about 2 weeks ago.  Patient complained of increased tiredness and weakness, so she came to the ED for further evaluation.   In ED, patient noted to be hypotensive, hypothermic, with acute anemia Hgb 6.7, plt 49, TBili 10.5,m mild transaminitis, Ammonia 130. Cr 5.79, BUN 50. Was given 1 PRBC and 1 plt at [facility].  ECG with prolonged qtc 502, Sinus bradycardia   Past Medical History  HTN, spinal stenosis, hypothyroidism, chronic Hep C, EtOH use w/ resultant cirrhosis, [facility] Events   2/11 admitted to ICU  Consults:  -GI -nephrology  Procedures:    Significant Diagnostic Tests:  EKG - QT prolongation  Micro Data:  2/11 MRSA neg 2/11 Covid neg, Influenza A & B neg  Antimicrobials:  CTX 2/11 - (for SBP ppx)   Interim history/subjective:  Patient severely agitated and yelling incomprehensibly overnight.  Accidentally removed 1 PIV and flexiseal.  Unable to give haldol due to prolonged QTc.  Allergy to  meperidine so unable to give Fentanyl.  Low does precedex was ordered after versed was ineffective.  O2 requirements increased throughout the night.    Objective   Blood pressure (!) 85/45, pulse (!) 41, temperature (!) 95.7 F (35.4 C), resp. rate (!) 0, height 5' 5"  (1.651 m), weight 116.4 kg, SpO2 (!) 84 %.        Intake/Output Summary (Last 24 hours) at 2019-06-19 0930 Last data filed at 19-Jun-2019 0900 Gross per 24 hour  Intake 4310.07 ml  Output 1135 ml  Net 3175.07 ml   Filed Weights   05/28/2019 1913 06/02/19 0500 06/19/2019 0500  Weight: 90.7 kg 92 kg 116.4 kg    Examination: General: jaundiced, chronically ill appearing woman laying in bed HENT: venturi mask in place, sclera icteric Lungs: coarse breath sounds bilaterally Cardiovascular: RRR Abdomen: soft, non-tender, mildly distended Extremities: warm, dry Neuro: disoriented, nonresponsive Skin: bruising on L lower leg and L chest  Resolved Hospital Problem list     Assessment & Plan:  Hemorrhagic Shock 2/2 Acute GI bleed: HGB 6.7 on admission with coffee-colored emesis and bloody stools x3 weeks.  GI saw her yesterday (2/12) and said unable to perform EGD at that time due to elevated INR (4.1).   -Transition to comfort care  Acute kidney injury on ?? CKD 5: Nephrology Creatinine on admission 5.79 (no recent labs for comparison of baseline creatinine-last creatinine on record was 0.6 about 4 years ago).  Oliguric.  Nephrology saw  her yesterday (2/12) and said that she is not a candidate for hemodialysis. -Transition to comfort care  Hyperammonemia, transaminitis, coagulopathy likely 2/2 patient's history of chronic Hep C, EtOH abuse, & Cirrhosis: On admission, ammonia level of 130, AST 136, ALT 99.  INR 5.8.  MELD score of 40 indicates poor prognosis and 71.3% 3 month mortality. -Transition to comfort care  Acute Encephalopathy: Patient now disoriented and nonresponsive.  Multiple reasons for AMS including hepatic  encephalopathy, uremia, h/o EtOH use -Transition to comfort care  Prolonged QTc: Mg level 1.0 this morning (2/11).  Repeat EKG 2/11 with QTc 443m. -Avoid QT prolonging drugs -Transition to comfort care  EtOH Abuse, macrocytic anemia Hyponatremia Thrombocytopenia Hypoalbuminemia 2/2 moderate protein calorie malnutrition Hypothermia Hypothyroidism Acute Hypotension, Chronic Essential hypertension Nonspecific abnormal toxicology: Urine drug screen was positive for benzodiazepine, but home meds include diazepam  Best practice:  Diet: NPO Pain/Anxiety/Delirium protocol (if indicated): n/a VAP protocol (if indicated): n/a DVT prophylaxis: SCD GI prophylaxis: protonix gtt Glucose control: monitor Mobility: BR Code Status: DNR Family Communication: d/w husband - he is contacting children Disposition: ICU  Labs   CBC: Recent Labs  Lab 06/09/2019 1918 06/02/19 0257 06/02/19 0851 06/02/19 2112  WBC 8.0 6.9 6.2 6.3  NEUTROABS  --   --   --  5.0  HGB 6.7* 7.5* 6.8* 8.0*  HCT 17.9* 20.5* 18.4* 21.7*  MCV 109.1* 105.7* 104.5* 96.9  PLT 49* 79* 88* 74*    Basic Metabolic Panel: Recent Labs  Lab 05/28/2019 1918 06/02/19 0257 06/02/19 2112 [id number]  NA 127* 128* 130* 132*  K 4.8 5.0 4.7 4.6  CL 88* 93* 90* 92*  CO2 20* 18* 22 23  GLUCOSE 96 116* 239* 243*  BUN 50* 46* 48* 48*  CREATININE 5.79* 5.90* 5.64* 5.81*  CALCIUM 7.7* 7.4* 8.0* 7.8*  MG  --  1.0*  --  1.5*   GFR: Estimated Creatinine Clearance: 12.8 mL/min (A) (by C-G formula based on SCr of 5.81 mg/dL (H)). Recent Labs  Lab 06/07/2019 1918 06/02/19 0257 06/02/19 0851 06/02/19 2112  WBC 8.0 6.9 6.2 6.3    Liver Function Tests: Recent Labs  Lab 06/17/2019 1918 06/02/19 0257 06/02/19 2112 [id number]  AST 136* 131* 138* 135*  ALT 99* 100* 103* 99*  ALKPHOS 80 84 82 78  BILITOT 10.5* 10.6* 11.8* 11.3*  PROT 5.9* 5.9* 6.9 6.7  ALBUMIN 2.1* 2.1* 3.0* 3.1*   No results for input(s): LIPASE,  AMYLASE in the last 168 hours. Recent Labs  Lab 06/10/2019 2139 06/02/19 0611 06/02/19 2112  AMMONIA 130* 143* 133*    ABG No results found for: PHART, PCO2ART, PO2ART, HCO3, TCO2, ACIDBASEDEF, O2SAT   Coagulation Profile: Recent Labs  Lab 06/16/2019 1918 06/02/19 0257 06/02/19 2112 [id number]  INR 5.8* 4.1* 2.6* 3.0*    Cardiac Enzymes: No results for input(s): CKTOTAL, CKMB, CKMBINDEX, TROPONINI in the last 168 hours.  HbA1C: No results found for: HGBA1C  CBG: Recent Labs  Lab 06/02/19 1523 06/02/19 1945 06/02/19 2340 [id number] 0Feb 16, [id number]  GLUCAP 226* 211* 228* 225* 204*    Review of Systems:   Pt denies belly pain.  Past Medical History  She,  has a past medical history of Hepatitis C, Hypertension, Stroke ([facility], and Thyroid disease.   Surgical History    Past Surgical History:  Procedure Laterality Date  . APPENDECTOMY    . CHOLECYSTECTOMY    . HAND SURGERY       Social History  reports that she has been smoking. She has never used smokeless tobacco. She reports current alcohol use. She reports that she does not use drugs.   Family History   Her family history is not on file.   Allergies Allergies  Allergen Reactions  . Demerol [Meperidine] Anaphylaxis  . Morphine And Related Anaphylaxis  . Asa [Aspirin] Other (See Comments)    bleeding  . Biaxin [Clarithromycin] Other (See Comments)    unknown  . Nsaids Nausea And Vomiting     Home Medications  Prior to Admission medications   Medication Sig Start Date End Date Taking? Authorizing Provider  carvedilol (COREG) 25 MG tablet Take 25 mg by mouth 2 (two) times daily with a meal.    [provider]  cloNIDine (CATAPRES) 0.2 MG tablet Take 0.2 mg by mouth 2 (two) times daily.    [provider]  diazepam (VALIUM) 10 MG tablet Take 10 mg by mouth every 6 (six) hours as needed for anxiety.    [provider]  EPINEPHrine 0.3 mg/0.3 mL IJ SOAJ injection Inject  0.3 mg into the muscle daily as needed (allergic reaction).    [provider]  levothyroxine (SYNTHROID, LEVOTHROID) 150 MCG tablet Take 150 mcg by mouth daily before breakfast.    [provider]  lisinopril (PRINIVIL,ZESTRIL) 40 MG tablet Take 40 mg by mouth daily.    [provider]  metoCLOPramide (REGLAN) 10 MG tablet Take 1 tablet (10 mg total) by mouth every 6 (six) hours as needed for nausea (or headache). 06/24/98   Delora Fuel, MD  oxyCODONE-acetaminophen (PERCOCET/ROXICET) 5-325 MG per tablet Take 1 tablet by mouth every 4 (four) hours as needed for severe pain.    [provider]     Critical care time: 40 minutes     Mallie Darting, MS4

## 2019-06-20 NOTE — Progress Notes (Signed)
175 mls of Fentanyl and 25 mls of Versed wasted and witnessed with Forestine Na, RN

## 2019-06-20 NOTE — Progress Notes (Signed)
Nephrology Dr. Posey Pronto evaluated patient. CO2 23 okay to stop bicarb in setting of worsening pulmonary function.

## 2019-06-20 NOTE — Progress Notes (Signed)
Patient severely agitated and yelling. Patient is incoherent and words are garbled. Not following commands.  Thrashing around the bed. Has accidentally removed 1 PIV and flexiseal-- now down to 2 PIVs. Elink contacted. Will not order precedex or haldol due to prolonged qtc. EKG obtained with QTc of 424. Will not order fentanyl due to allergies. Elink contacted Dr. Gilford Raid for consideration of CVC-- will not place at this time. Will try versed. Will have to stop Protonix and/or Octreotide due to compatibility issues with current infusions and only 2 PIVs.

## 2019-06-20 DEATH — deceased

## 2021-06-24 IMAGING — US US RENAL
1 series · 14 of 25 positions shown · non-contrast
Comparison: None.

CLINICAL DATA: Acute renal insufficiency, hepatitis C, hypertension

EXAM:
RENAL / URINARY TRACT ULTRASOUND COMPLETE

[Series 1: us renal · 14 of 29 slices shown]
[im 1/29]
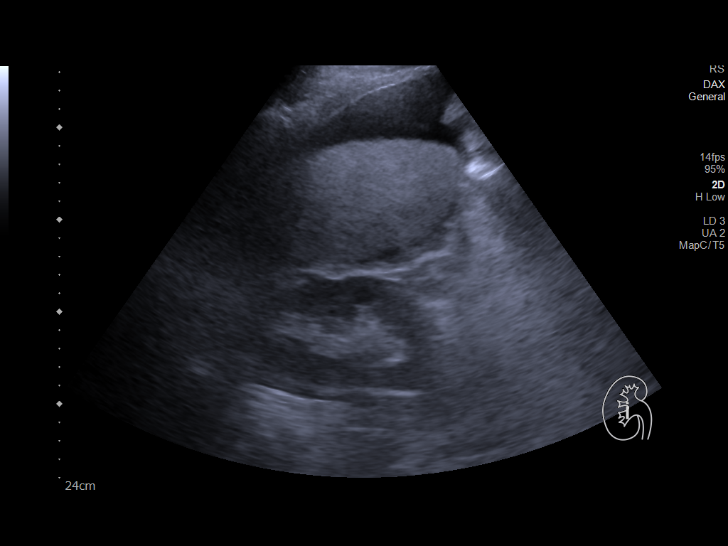
[im 3/29]
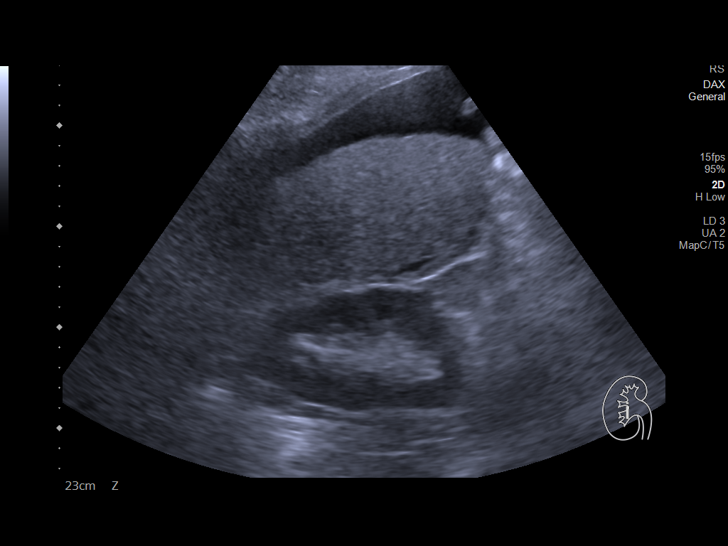
[im 5/29]
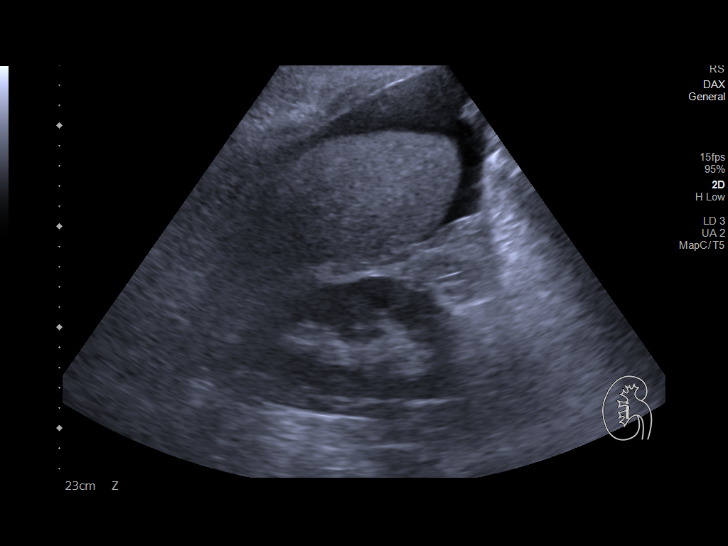
[im 8/29]
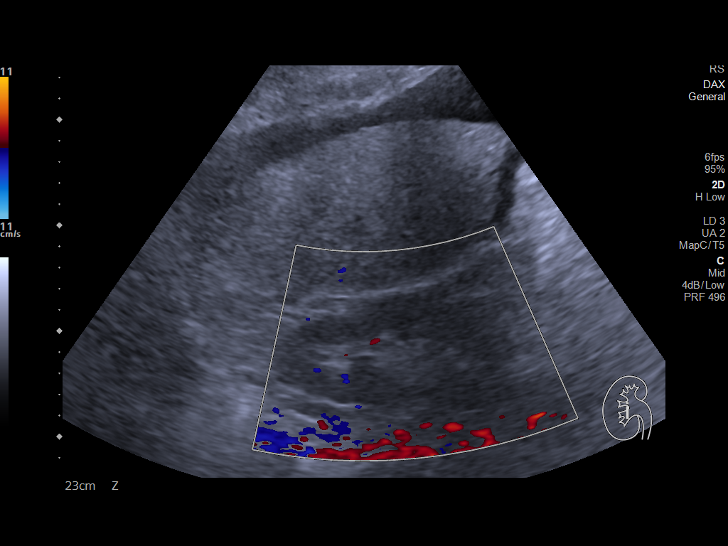
[im 10/29]
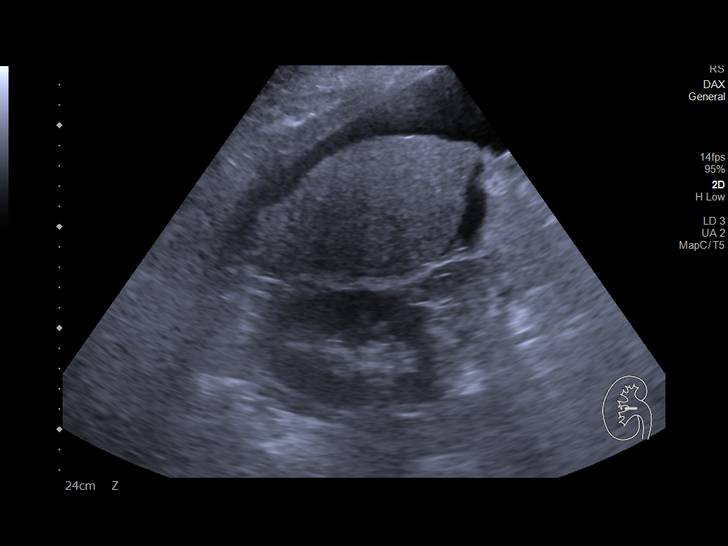
[im 11/29]
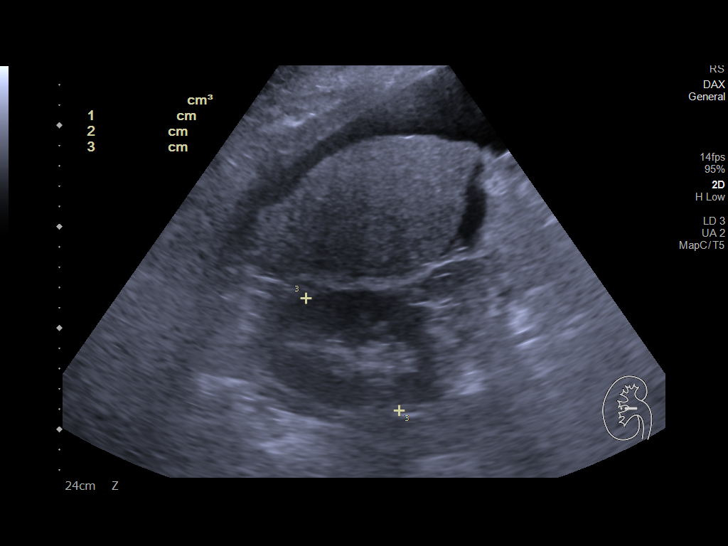
[im 13/29]
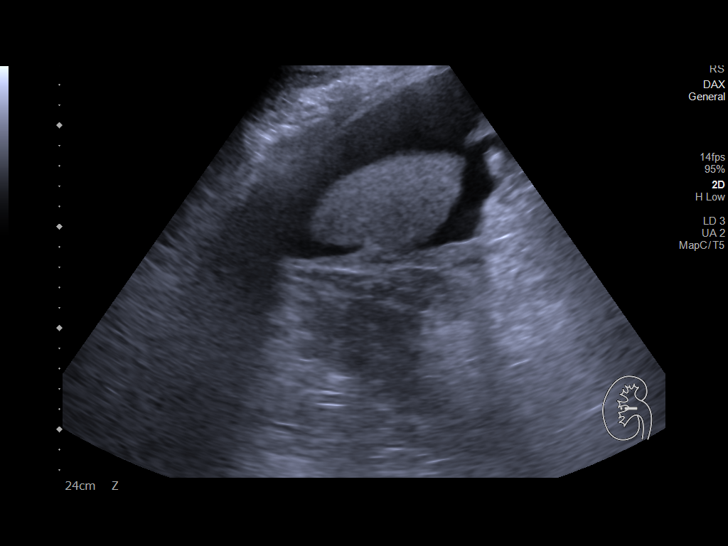
[im 16/29]
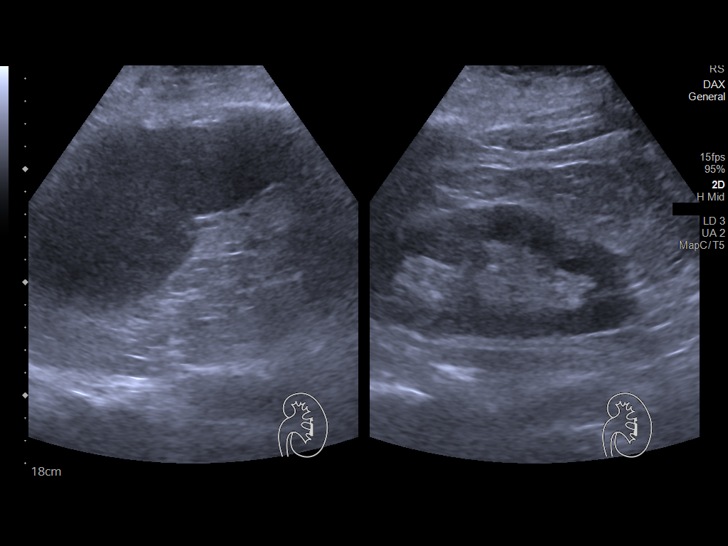
[im 18/29]
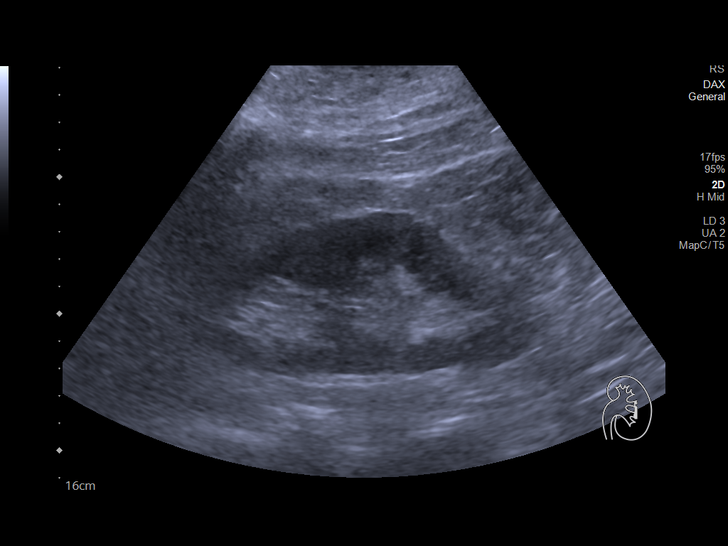
[im 19/29]
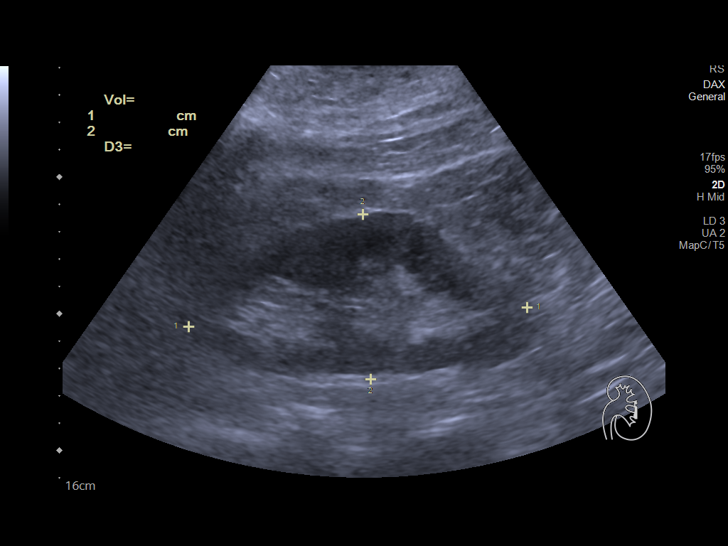
[im 22/29]
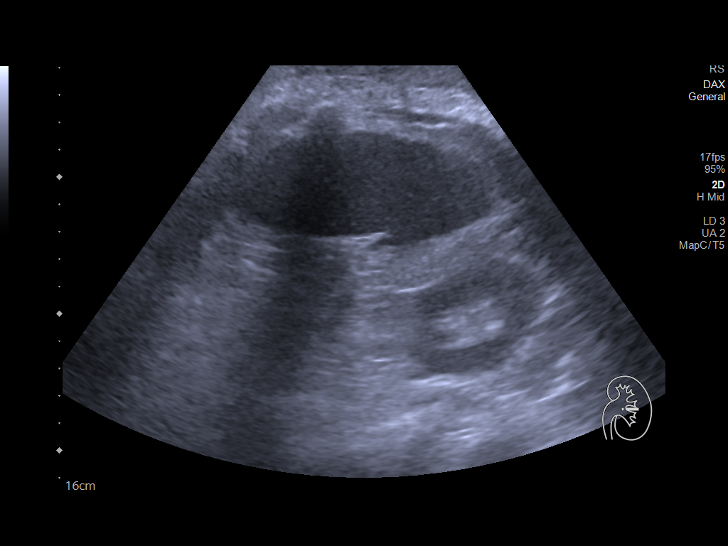
[im 24/29]
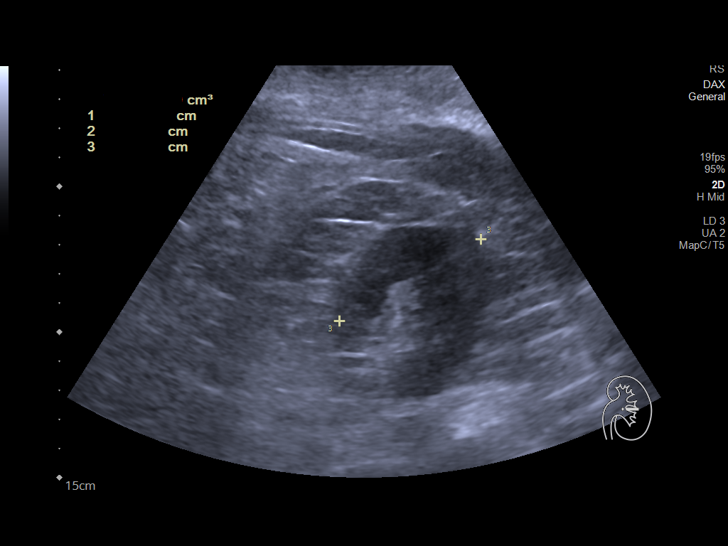
[im 26/29]
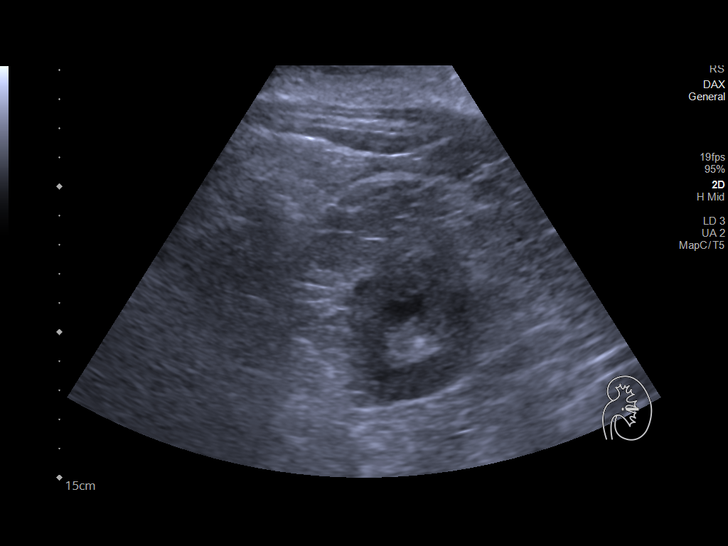
[im 29/29]
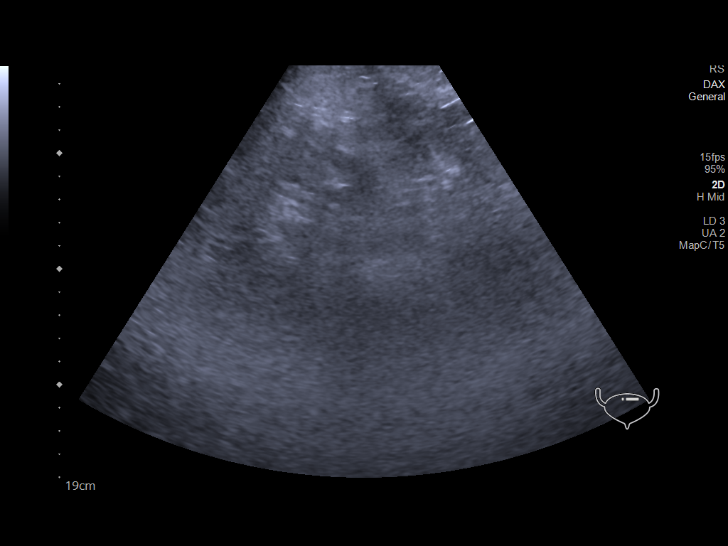

[14 of 25 positions shown; findings below may reference images not displayed]

FINDINGS: Right Kidney:

Renal measurements: 11.2 x 6.2 x 7.2 cm = volume: 263 mL .
Echogenicity within normal limits. No mass or hydronephrosis
visualized.

Left Kidney:

Renal measurements: 12.4 x 6.0 x 5.6 cm = volume: 220 mL.
Echogenicity within normal limits. No mass or hydronephrosis
visualized.

Bladder:

The bladder is totally decompressed which limits evaluation.

Other:

Incidental ascites is seen within the upper abdomen. Nodular contour
of the liver suggests cirrhosis.
IMPRESSION: 1. Unremarkable appearance of the kidneys.
2. Evidence of cirrhosis, with trace ascites in the upper abdomen.
